# Patient Record
Sex: Female | Born: 1935 | Race: White | Hispanic: No | Marital: Married | Smoking: Never smoker
Health system: Southern US, Community
[De-identification: ages and names within clinical notes are randomized; demographics above are authoritative.]

## PROBLEM LIST (undated history)

## (undated) DIAGNOSIS — E559 Vitamin D deficiency, unspecified: Secondary | ICD-10-CM

## (undated) DIAGNOSIS — I10 Essential (primary) hypertension: Secondary | ICD-10-CM

## (undated) DIAGNOSIS — M549 Dorsalgia, unspecified: Secondary | ICD-10-CM

## (undated) DIAGNOSIS — I059 Rheumatic mitral valve disease, unspecified: Secondary | ICD-10-CM

## (undated) DIAGNOSIS — C801 Malignant (primary) neoplasm, unspecified: Secondary | ICD-10-CM

## (undated) DIAGNOSIS — M1990 Unspecified osteoarthritis, unspecified site: Secondary | ICD-10-CM

## (undated) DIAGNOSIS — S52209A Unspecified fracture of shaft of unspecified ulna, initial encounter for closed fracture: Secondary | ICD-10-CM

## (undated) DIAGNOSIS — R519 Headache, unspecified: Secondary | ICD-10-CM

## (undated) DIAGNOSIS — M810 Age-related osteoporosis without current pathological fracture: Secondary | ICD-10-CM

## (undated) DIAGNOSIS — R011 Cardiac murmur, unspecified: Secondary | ICD-10-CM

## (undated) DIAGNOSIS — G8929 Other chronic pain: Secondary | ICD-10-CM

## (undated) DIAGNOSIS — E039 Hypothyroidism, unspecified: Secondary | ICD-10-CM

## (undated) DIAGNOSIS — R51 Headache: Secondary | ICD-10-CM

## (undated) HISTORY — PX: MASTECTOMY: SHX3

## (undated) HISTORY — PX: ABDOMINAL HYSTERECTOMY: SHX81

## (undated) HISTORY — PX: KNEE ARTHROSCOPY: SHX127

## (undated) HISTORY — PX: TONSILLECTOMY: SUR1361

## (undated) HISTORY — PX: APPENDECTOMY: SHX54

## (undated) HISTORY — PX: VARICOSE VEIN SURGERY: SHX832

## (undated) HISTORY — PX: FINGER MASS EXCISION: SHX1638

## (undated) HISTORY — PX: CERVICAL FUSION: SHX112

## (undated) HISTORY — PX: CATARACT EXTRACTION W/ INTRAOCULAR LENS  IMPLANT, BILATERAL: SHX1307

## (undated) HISTORY — PX: TOTAL MASTECTOMY: SHX6129

## (undated) HISTORY — PX: BIOPSY BREAST: PRO8

## (undated) HISTORY — DX: Age-related osteoporosis without current pathological fracture: M81.0

## (undated) HISTORY — DX: Vitamin D deficiency, unspecified: E55.9

## (undated) HISTORY — PX: BREAST SURGERY: SHX581

## (undated) HISTORY — PX: BACK SURGERY: SHX140

## (undated) HISTORY — PX: CARPAL TUNNEL RELEASE: SHX101

## (undated) HISTORY — PX: BREAST RECONSTRUCTION WITH PLACEMENT OF TISSUE EXPANDER AND FLEX HD (ACELLULAR HYDRATED DERMIS): SHX6295

---

## 2002-03-20 ENCOUNTER — Encounter: Payer: Self-pay | Admitting: *Deleted

## 2002-03-20 ENCOUNTER — Encounter: Admission: RE | Admit: 2002-03-20 | Discharge: 2002-03-20 | Payer: Self-pay | Admitting: *Deleted

## 2006-12-23 ENCOUNTER — Encounter: Admission: RE | Admit: 2006-12-23 | Discharge: 2006-12-23 | Payer: Self-pay | Admitting: Orthopedic Surgery

## 2007-01-07 ENCOUNTER — Encounter: Admission: RE | Admit: 2007-01-07 | Discharge: 2007-01-07 | Payer: Self-pay | Admitting: Orthopedic Surgery

## 2007-01-21 ENCOUNTER — Encounter: Admission: RE | Admit: 2007-01-21 | Discharge: 2007-01-21 | Payer: Self-pay | Admitting: Orthopedic Surgery

## 2007-09-19 ENCOUNTER — Encounter: Admission: RE | Admit: 2007-09-19 | Discharge: 2007-09-19 | Payer: Self-pay | Admitting: Orthopedic Surgery

## 2007-10-12 ENCOUNTER — Encounter: Admission: RE | Admit: 2007-10-12 | Discharge: 2007-10-12 | Payer: Self-pay | Admitting: Neurosurgery

## 2008-02-28 ENCOUNTER — Encounter: Admission: RE | Admit: 2008-02-28 | Discharge: 2008-02-28 | Payer: Self-pay | Admitting: Orthopedic Surgery

## 2008-03-21 ENCOUNTER — Encounter: Admission: RE | Admit: 2008-03-21 | Discharge: 2008-03-21 | Payer: Self-pay | Admitting: Orthopedic Surgery

## 2008-04-04 ENCOUNTER — Encounter: Admission: RE | Admit: 2008-04-04 | Discharge: 2008-04-04 | Payer: Self-pay | Admitting: Orthopedic Surgery

## 2008-07-09 ENCOUNTER — Encounter: Admission: RE | Admit: 2008-07-09 | Discharge: 2008-07-09 | Payer: Self-pay | Admitting: Orthopedic Surgery

## 2008-07-24 ENCOUNTER — Encounter: Admission: RE | Admit: 2008-07-24 | Discharge: 2008-07-24 | Payer: Self-pay | Admitting: Orthopedic Surgery

## 2010-01-13 ENCOUNTER — Ambulatory Visit: Payer: Self-pay | Admitting: Genetic Counselor

## 2010-01-25 ENCOUNTER — Encounter: Admission: RE | Admit: 2010-01-25 | Discharge: 2010-01-25 | Payer: Self-pay | Admitting: Orthopaedic Surgery

## 2010-06-16 ENCOUNTER — Encounter: Payer: Self-pay | Admitting: Genetic Counselor

## 2011-03-19 DIAGNOSIS — M545 Low back pain: Secondary | ICD-10-CM | POA: Diagnosis not present

## 2011-03-19 DIAGNOSIS — M542 Cervicalgia: Secondary | ICD-10-CM | POA: Diagnosis not present

## 2011-03-28 DIAGNOSIS — H6980 Other specified disorders of Eustachian tube, unspecified ear: Secondary | ICD-10-CM | POA: Diagnosis not present

## 2011-03-31 DIAGNOSIS — Z853 Personal history of malignant neoplasm of breast: Secondary | ICD-10-CM | POA: Diagnosis not present

## 2011-03-31 DIAGNOSIS — N63 Unspecified lump in unspecified breast: Secondary | ICD-10-CM | POA: Diagnosis not present

## 2011-04-02 DIAGNOSIS — Z9889 Other specified postprocedural states: Secondary | ICD-10-CM | POA: Diagnosis not present

## 2011-04-02 DIAGNOSIS — C50119 Malignant neoplasm of central portion of unspecified female breast: Secondary | ICD-10-CM | POA: Diagnosis not present

## 2011-04-02 DIAGNOSIS — N63 Unspecified lump in unspecified breast: Secondary | ICD-10-CM | POA: Diagnosis not present

## 2011-04-07 DIAGNOSIS — Z853 Personal history of malignant neoplasm of breast: Secondary | ICD-10-CM | POA: Diagnosis not present

## 2011-04-07 DIAGNOSIS — Z9010 Acquired absence of unspecified breast and nipple: Secondary | ICD-10-CM | POA: Diagnosis not present

## 2011-04-07 DIAGNOSIS — D4860 Neoplasm of uncertain behavior of unspecified breast: Secondary | ICD-10-CM | POA: Diagnosis not present

## 2011-04-07 DIAGNOSIS — N63 Unspecified lump in unspecified breast: Secondary | ICD-10-CM | POA: Diagnosis not present

## 2011-04-16 DIAGNOSIS — D4860 Neoplasm of uncertain behavior of unspecified breast: Secondary | ICD-10-CM | POA: Diagnosis not present

## 2011-04-16 DIAGNOSIS — Z9010 Acquired absence of unspecified breast and nipple: Secondary | ICD-10-CM | POA: Diagnosis not present

## 2011-04-16 DIAGNOSIS — Z853 Personal history of malignant neoplasm of breast: Secondary | ICD-10-CM | POA: Diagnosis not present

## 2011-05-07 DIAGNOSIS — I359 Nonrheumatic aortic valve disorder, unspecified: Secondary | ICD-10-CM | POA: Diagnosis not present

## 2011-05-07 DIAGNOSIS — E039 Hypothyroidism, unspecified: Secondary | ICD-10-CM | POA: Diagnosis not present

## 2011-05-07 DIAGNOSIS — Z1211 Encounter for screening for malignant neoplasm of colon: Secondary | ICD-10-CM | POA: Diagnosis not present

## 2011-05-07 DIAGNOSIS — Z79899 Other long term (current) drug therapy: Secondary | ICD-10-CM | POA: Diagnosis not present

## 2011-05-07 DIAGNOSIS — E782 Mixed hyperlipidemia: Secondary | ICD-10-CM | POA: Diagnosis not present

## 2011-05-13 DIAGNOSIS — M47812 Spondylosis without myelopathy or radiculopathy, cervical region: Secondary | ICD-10-CM | POA: Diagnosis not present

## 2011-05-13 DIAGNOSIS — M542 Cervicalgia: Secondary | ICD-10-CM | POA: Diagnosis not present

## 2011-05-20 DIAGNOSIS — M25519 Pain in unspecified shoulder: Secondary | ICD-10-CM | POA: Diagnosis not present

## 2011-05-20 DIAGNOSIS — M542 Cervicalgia: Secondary | ICD-10-CM | POA: Diagnosis not present

## 2011-06-04 DIAGNOSIS — M5412 Radiculopathy, cervical region: Secondary | ICD-10-CM | POA: Diagnosis not present

## 2011-06-04 DIAGNOSIS — M542 Cervicalgia: Secondary | ICD-10-CM | POA: Diagnosis not present

## 2011-06-04 DIAGNOSIS — M5030 Other cervical disc degeneration, unspecified cervical region: Secondary | ICD-10-CM | POA: Diagnosis not present

## 2011-06-09 DIAGNOSIS — M5030 Other cervical disc degeneration, unspecified cervical region: Secondary | ICD-10-CM | POA: Diagnosis not present

## 2011-06-09 DIAGNOSIS — M5412 Radiculopathy, cervical region: Secondary | ICD-10-CM | POA: Diagnosis not present

## 2011-06-09 DIAGNOSIS — M542 Cervicalgia: Secondary | ICD-10-CM | POA: Diagnosis not present

## 2011-06-23 DIAGNOSIS — M542 Cervicalgia: Secondary | ICD-10-CM | POA: Diagnosis not present

## 2011-06-23 DIAGNOSIS — M5030 Other cervical disc degeneration, unspecified cervical region: Secondary | ICD-10-CM | POA: Diagnosis not present

## 2011-06-29 DIAGNOSIS — M5030 Other cervical disc degeneration, unspecified cervical region: Secondary | ICD-10-CM | POA: Diagnosis not present

## 2011-06-29 DIAGNOSIS — M542 Cervicalgia: Secondary | ICD-10-CM | POA: Diagnosis not present

## 2011-07-01 DIAGNOSIS — C50119 Malignant neoplasm of central portion of unspecified female breast: Secondary | ICD-10-CM | POA: Diagnosis not present

## 2011-07-10 DIAGNOSIS — N390 Urinary tract infection, site not specified: Secondary | ICD-10-CM | POA: Diagnosis not present

## 2011-07-13 DIAGNOSIS — K649 Unspecified hemorrhoids: Secondary | ICD-10-CM | POA: Diagnosis not present

## 2011-07-23 DIAGNOSIS — M542 Cervicalgia: Secondary | ICD-10-CM | POA: Diagnosis not present

## 2011-07-23 DIAGNOSIS — Z79899 Other long term (current) drug therapy: Secondary | ICD-10-CM | POA: Diagnosis not present

## 2011-07-23 DIAGNOSIS — Z5181 Encounter for therapeutic drug level monitoring: Secondary | ICD-10-CM | POA: Diagnosis not present

## 2011-07-23 DIAGNOSIS — M5030 Other cervical disc degeneration, unspecified cervical region: Secondary | ICD-10-CM | POA: Diagnosis not present

## 2011-07-30 DIAGNOSIS — Z853 Personal history of malignant neoplasm of breast: Secondary | ICD-10-CM | POA: Diagnosis not present

## 2011-07-30 DIAGNOSIS — Z800 Family history of malignant neoplasm of digestive organs: Secondary | ICD-10-CM | POA: Diagnosis not present

## 2011-07-30 DIAGNOSIS — I059 Rheumatic mitral valve disease, unspecified: Secondary | ICD-10-CM | POA: Diagnosis not present

## 2011-07-30 DIAGNOSIS — E079 Disorder of thyroid, unspecified: Secondary | ICD-10-CM | POA: Diagnosis not present

## 2011-07-30 DIAGNOSIS — M129 Arthropathy, unspecified: Secondary | ICD-10-CM | POA: Diagnosis not present

## 2011-07-30 DIAGNOSIS — K5730 Diverticulosis of large intestine without perforation or abscess without bleeding: Secondary | ICD-10-CM | POA: Diagnosis not present

## 2011-07-30 DIAGNOSIS — Z1211 Encounter for screening for malignant neoplasm of colon: Secondary | ICD-10-CM | POA: Diagnosis not present

## 2011-07-30 DIAGNOSIS — I10 Essential (primary) hypertension: Secondary | ICD-10-CM | POA: Diagnosis not present

## 2011-07-30 DIAGNOSIS — K644 Residual hemorrhoidal skin tags: Secondary | ICD-10-CM | POA: Diagnosis not present

## 2011-08-18 DIAGNOSIS — Z9010 Acquired absence of unspecified breast and nipple: Secondary | ICD-10-CM | POA: Diagnosis not present

## 2011-08-18 DIAGNOSIS — Z853 Personal history of malignant neoplasm of breast: Secondary | ICD-10-CM | POA: Diagnosis not present

## 2011-09-28 DIAGNOSIS — M545 Low back pain: Secondary | ICD-10-CM | POA: Diagnosis not present

## 2011-09-28 DIAGNOSIS — M542 Cervicalgia: Secondary | ICD-10-CM | POA: Diagnosis not present

## 2011-10-15 DIAGNOSIS — N3000 Acute cystitis without hematuria: Secondary | ICD-10-CM | POA: Diagnosis not present

## 2011-11-02 DIAGNOSIS — M542 Cervicalgia: Secondary | ICD-10-CM | POA: Diagnosis not present

## 2011-11-02 DIAGNOSIS — M47812 Spondylosis without myelopathy or radiculopathy, cervical region: Secondary | ICD-10-CM | POA: Diagnosis not present

## 2011-11-02 DIAGNOSIS — M47817 Spondylosis without myelopathy or radiculopathy, lumbosacral region: Secondary | ICD-10-CM | POA: Diagnosis not present

## 2011-11-05 DIAGNOSIS — Z09 Encounter for follow-up examination after completed treatment for conditions other than malignant neoplasm: Secondary | ICD-10-CM | POA: Diagnosis not present

## 2011-11-05 DIAGNOSIS — Z853 Personal history of malignant neoplasm of breast: Secondary | ICD-10-CM | POA: Diagnosis not present

## 2012-01-06 DIAGNOSIS — M545 Low back pain: Secondary | ICD-10-CM | POA: Diagnosis not present

## 2012-01-06 DIAGNOSIS — M961 Postlaminectomy syndrome, not elsewhere classified: Secondary | ICD-10-CM | POA: Diagnosis not present

## 2012-01-06 DIAGNOSIS — M5126 Other intervertebral disc displacement, lumbar region: Secondary | ICD-10-CM | POA: Diagnosis not present

## 2012-01-12 DIAGNOSIS — M545 Low back pain: Secondary | ICD-10-CM | POA: Diagnosis not present

## 2012-01-12 DIAGNOSIS — M961 Postlaminectomy syndrome, not elsewhere classified: Secondary | ICD-10-CM | POA: Diagnosis not present

## 2012-01-19 DIAGNOSIS — Z9010 Acquired absence of unspecified breast and nipple: Secondary | ICD-10-CM | POA: Diagnosis not present

## 2012-01-19 DIAGNOSIS — Z853 Personal history of malignant neoplasm of breast: Secondary | ICD-10-CM | POA: Diagnosis not present

## 2012-02-02 DIAGNOSIS — M545 Low back pain: Secondary | ICD-10-CM | POA: Diagnosis not present

## 2012-02-02 DIAGNOSIS — M961 Postlaminectomy syndrome, not elsewhere classified: Secondary | ICD-10-CM | POA: Diagnosis not present

## 2012-03-03 DIAGNOSIS — M961 Postlaminectomy syndrome, not elsewhere classified: Secondary | ICD-10-CM | POA: Diagnosis not present

## 2012-03-03 DIAGNOSIS — M545 Low back pain: Secondary | ICD-10-CM | POA: Diagnosis not present

## 2012-03-07 DIAGNOSIS — Z5181 Encounter for therapeutic drug level monitoring: Secondary | ICD-10-CM | POA: Diagnosis not present

## 2012-03-07 DIAGNOSIS — M545 Low back pain: Secondary | ICD-10-CM | POA: Diagnosis not present

## 2012-03-29 DIAGNOSIS — M545 Low back pain: Secondary | ICD-10-CM | POA: Diagnosis not present

## 2012-03-29 DIAGNOSIS — M47817 Spondylosis without myelopathy or radiculopathy, lumbosacral region: Secondary | ICD-10-CM | POA: Diagnosis not present

## 2012-03-29 DIAGNOSIS — M961 Postlaminectomy syndrome, not elsewhere classified: Secondary | ICD-10-CM | POA: Diagnosis not present

## 2012-04-04 DIAGNOSIS — Z1231 Encounter for screening mammogram for malignant neoplasm of breast: Secondary | ICD-10-CM | POA: Diagnosis not present

## 2012-04-04 DIAGNOSIS — Z9889 Other specified postprocedural states: Secondary | ICD-10-CM | POA: Diagnosis not present

## 2012-04-04 DIAGNOSIS — C50119 Malignant neoplasm of central portion of unspecified female breast: Secondary | ICD-10-CM | POA: Diagnosis not present

## 2012-04-05 DIAGNOSIS — Z23 Encounter for immunization: Secondary | ICD-10-CM | POA: Diagnosis not present

## 2012-05-05 DIAGNOSIS — M961 Postlaminectomy syndrome, not elsewhere classified: Secondary | ICD-10-CM | POA: Diagnosis not present

## 2012-05-05 DIAGNOSIS — M47817 Spondylosis without myelopathy or radiculopathy, lumbosacral region: Secondary | ICD-10-CM | POA: Diagnosis not present

## 2012-05-05 DIAGNOSIS — M545 Low back pain: Secondary | ICD-10-CM | POA: Diagnosis not present

## 2012-05-07 DIAGNOSIS — N3000 Acute cystitis without hematuria: Secondary | ICD-10-CM | POA: Diagnosis not present

## 2012-05-09 DIAGNOSIS — I10 Essential (primary) hypertension: Secondary | ICD-10-CM | POA: Diagnosis not present

## 2012-05-09 DIAGNOSIS — I359 Nonrheumatic aortic valve disorder, unspecified: Secondary | ICD-10-CM | POA: Diagnosis not present

## 2012-05-09 DIAGNOSIS — Z79899 Other long term (current) drug therapy: Secondary | ICD-10-CM | POA: Diagnosis not present

## 2012-05-09 DIAGNOSIS — E782 Mixed hyperlipidemia: Secondary | ICD-10-CM | POA: Diagnosis not present

## 2012-05-09 DIAGNOSIS — E039 Hypothyroidism, unspecified: Secondary | ICD-10-CM | POA: Diagnosis not present

## 2012-05-18 DIAGNOSIS — E039 Hypothyroidism, unspecified: Secondary | ICD-10-CM | POA: Diagnosis not present

## 2012-05-18 DIAGNOSIS — I359 Nonrheumatic aortic valve disorder, unspecified: Secondary | ICD-10-CM | POA: Diagnosis not present

## 2012-05-18 DIAGNOSIS — Z0000 Encounter for general adult medical examination without abnormal findings: Secondary | ICD-10-CM | POA: Diagnosis not present

## 2012-05-18 DIAGNOSIS — M949 Disorder of cartilage, unspecified: Secondary | ICD-10-CM | POA: Diagnosis not present

## 2012-05-19 DIAGNOSIS — Z09 Encounter for follow-up examination after completed treatment for conditions other than malignant neoplasm: Secondary | ICD-10-CM | POA: Diagnosis not present

## 2012-05-19 DIAGNOSIS — Z853 Personal history of malignant neoplasm of breast: Secondary | ICD-10-CM | POA: Diagnosis not present

## 2012-05-25 DIAGNOSIS — Z1211 Encounter for screening for malignant neoplasm of colon: Secondary | ICD-10-CM | POA: Diagnosis not present

## 2012-06-07 ENCOUNTER — Other Ambulatory Visit: Payer: Self-pay | Admitting: Rehabilitation

## 2012-06-07 DIAGNOSIS — IMO0002 Reserved for concepts with insufficient information to code with codable children: Secondary | ICD-10-CM | POA: Diagnosis not present

## 2012-06-07 DIAGNOSIS — M545 Low back pain: Secondary | ICD-10-CM | POA: Diagnosis not present

## 2012-06-07 DIAGNOSIS — M961 Postlaminectomy syndrome, not elsewhere classified: Secondary | ICD-10-CM | POA: Diagnosis not present

## 2012-06-11 ENCOUNTER — Ambulatory Visit
Admission: RE | Admit: 2012-06-11 | Discharge: 2012-06-11 | Disposition: A | Discharge status: Home / Self Care | Payer: Medicare Other | Source: Ambulatory Visit | Attending: Rehabilitation | Admitting: Rehabilitation

## 2012-06-11 DIAGNOSIS — M4310 Spondylolisthesis, site unspecified: Secondary | ICD-10-CM | POA: Diagnosis not present

## 2012-06-11 DIAGNOSIS — M48061 Spinal stenosis, lumbar region without neurogenic claudication: Secondary | ICD-10-CM | POA: Diagnosis not present

## 2012-06-11 DIAGNOSIS — M5126 Other intervertebral disc displacement, lumbar region: Secondary | ICD-10-CM | POA: Diagnosis not present

## 2012-06-11 DIAGNOSIS — M545 Low back pain: Secondary | ICD-10-CM

## 2012-06-11 DIAGNOSIS — C50919 Malignant neoplasm of unspecified site of unspecified female breast: Secondary | ICD-10-CM | POA: Diagnosis not present

## 2012-06-11 MED ORDER — GADOBENATE DIMEGLUMINE 529 MG/ML IV SOLN
15.0000 mL | Freq: Once | INTRAVENOUS | Status: AC | PRN
  Administered 2012-06-11: 15 mL via INTRAVENOUS

## 2012-06-20 DIAGNOSIS — D485 Neoplasm of uncertain behavior of skin: Secondary | ICD-10-CM | POA: Diagnosis not present

## 2012-06-20 DIAGNOSIS — L821 Other seborrheic keratosis: Secondary | ICD-10-CM | POA: Diagnosis not present

## 2012-06-20 DIAGNOSIS — L723 Sebaceous cyst: Secondary | ICD-10-CM | POA: Diagnosis not present

## 2012-06-22 DIAGNOSIS — Z79899 Other long term (current) drug therapy: Secondary | ICD-10-CM | POA: Diagnosis not present

## 2012-06-27 DIAGNOSIS — L723 Sebaceous cyst: Secondary | ICD-10-CM | POA: Diagnosis not present

## 2012-06-29 DIAGNOSIS — M5126 Other intervertebral disc displacement, lumbar region: Secondary | ICD-10-CM | POA: Diagnosis not present

## 2012-06-29 DIAGNOSIS — M545 Low back pain: Secondary | ICD-10-CM | POA: Diagnosis not present

## 2012-06-29 DIAGNOSIS — IMO0002 Reserved for concepts with insufficient information to code with codable children: Secondary | ICD-10-CM | POA: Diagnosis not present

## 2012-06-30 DIAGNOSIS — L259 Unspecified contact dermatitis, unspecified cause: Secondary | ICD-10-CM | POA: Diagnosis not present

## 2012-07-06 DIAGNOSIS — L570 Actinic keratosis: Secondary | ICD-10-CM | POA: Diagnosis not present

## 2012-07-28 DIAGNOSIS — M5126 Other intervertebral disc displacement, lumbar region: Secondary | ICD-10-CM | POA: Diagnosis not present

## 2012-07-28 DIAGNOSIS — IMO0002 Reserved for concepts with insufficient information to code with codable children: Secondary | ICD-10-CM | POA: Diagnosis not present

## 2012-07-28 DIAGNOSIS — M545 Low back pain: Secondary | ICD-10-CM | POA: Diagnosis not present

## 2012-07-28 DIAGNOSIS — M961 Postlaminectomy syndrome, not elsewhere classified: Secondary | ICD-10-CM | POA: Diagnosis not present

## 2012-08-15 DIAGNOSIS — IMO0002 Reserved for concepts with insufficient information to code with codable children: Secondary | ICD-10-CM | POA: Diagnosis not present

## 2012-08-15 DIAGNOSIS — M545 Low back pain: Secondary | ICD-10-CM | POA: Diagnosis not present

## 2012-08-15 DIAGNOSIS — M961 Postlaminectomy syndrome, not elsewhere classified: Secondary | ICD-10-CM | POA: Diagnosis not present

## 2012-08-26 DIAGNOSIS — G8929 Other chronic pain: Secondary | ICD-10-CM | POA: Diagnosis not present

## 2012-08-26 DIAGNOSIS — L738 Other specified follicular disorders: Secondary | ICD-10-CM | POA: Diagnosis not present

## 2012-09-06 DIAGNOSIS — G8929 Other chronic pain: Secondary | ICD-10-CM | POA: Diagnosis not present

## 2012-09-06 DIAGNOSIS — L738 Other specified follicular disorders: Secondary | ICD-10-CM | POA: Diagnosis not present

## 2012-09-06 DIAGNOSIS — D485 Neoplasm of uncertain behavior of skin: Secondary | ICD-10-CM | POA: Diagnosis not present

## 2012-09-14 DIAGNOSIS — M545 Low back pain: Secondary | ICD-10-CM | POA: Diagnosis not present

## 2012-09-14 DIAGNOSIS — M961 Postlaminectomy syndrome, not elsewhere classified: Secondary | ICD-10-CM | POA: Diagnosis not present

## 2012-09-14 DIAGNOSIS — M542 Cervicalgia: Secondary | ICD-10-CM | POA: Diagnosis not present

## 2012-10-04 DIAGNOSIS — M999 Biomechanical lesion, unspecified: Secondary | ICD-10-CM | POA: Diagnosis not present

## 2012-10-04 DIAGNOSIS — M9981 Other biomechanical lesions of cervical region: Secondary | ICD-10-CM | POA: Diagnosis not present

## 2012-10-04 DIAGNOSIS — M5137 Other intervertebral disc degeneration, lumbosacral region: Secondary | ICD-10-CM | POA: Diagnosis not present

## 2012-10-05 DIAGNOSIS — M999 Biomechanical lesion, unspecified: Secondary | ICD-10-CM | POA: Diagnosis not present

## 2012-10-05 DIAGNOSIS — M5137 Other intervertebral disc degeneration, lumbosacral region: Secondary | ICD-10-CM | POA: Diagnosis not present

## 2012-10-05 DIAGNOSIS — M9981 Other biomechanical lesions of cervical region: Secondary | ICD-10-CM | POA: Diagnosis not present

## 2012-10-07 DIAGNOSIS — M9981 Other biomechanical lesions of cervical region: Secondary | ICD-10-CM | POA: Diagnosis not present

## 2012-10-07 DIAGNOSIS — M5137 Other intervertebral disc degeneration, lumbosacral region: Secondary | ICD-10-CM | POA: Diagnosis not present

## 2012-10-07 DIAGNOSIS — M999 Biomechanical lesion, unspecified: Secondary | ICD-10-CM | POA: Diagnosis not present

## 2012-10-10 DIAGNOSIS — M9981 Other biomechanical lesions of cervical region: Secondary | ICD-10-CM | POA: Diagnosis not present

## 2012-10-10 DIAGNOSIS — M999 Biomechanical lesion, unspecified: Secondary | ICD-10-CM | POA: Diagnosis not present

## 2012-10-10 DIAGNOSIS — M5137 Other intervertebral disc degeneration, lumbosacral region: Secondary | ICD-10-CM | POA: Diagnosis not present

## 2012-10-12 DIAGNOSIS — M9981 Other biomechanical lesions of cervical region: Secondary | ICD-10-CM | POA: Diagnosis not present

## 2012-10-12 DIAGNOSIS — M999 Biomechanical lesion, unspecified: Secondary | ICD-10-CM | POA: Diagnosis not present

## 2012-10-12 DIAGNOSIS — M5137 Other intervertebral disc degeneration, lumbosacral region: Secondary | ICD-10-CM | POA: Diagnosis not present

## 2012-10-14 DIAGNOSIS — M999 Biomechanical lesion, unspecified: Secondary | ICD-10-CM | POA: Diagnosis not present

## 2012-10-14 DIAGNOSIS — M5137 Other intervertebral disc degeneration, lumbosacral region: Secondary | ICD-10-CM | POA: Diagnosis not present

## 2012-10-14 DIAGNOSIS — M9981 Other biomechanical lesions of cervical region: Secondary | ICD-10-CM | POA: Diagnosis not present

## 2012-10-18 DIAGNOSIS — M9981 Other biomechanical lesions of cervical region: Secondary | ICD-10-CM | POA: Diagnosis not present

## 2012-10-18 DIAGNOSIS — M5137 Other intervertebral disc degeneration, lumbosacral region: Secondary | ICD-10-CM | POA: Diagnosis not present

## 2012-10-18 DIAGNOSIS — M999 Biomechanical lesion, unspecified: Secondary | ICD-10-CM | POA: Diagnosis not present

## 2012-10-20 DIAGNOSIS — M545 Low back pain: Secondary | ICD-10-CM | POA: Diagnosis not present

## 2012-10-20 DIAGNOSIS — M542 Cervicalgia: Secondary | ICD-10-CM | POA: Diagnosis not present

## 2012-10-20 DIAGNOSIS — M961 Postlaminectomy syndrome, not elsewhere classified: Secondary | ICD-10-CM | POA: Diagnosis not present

## 2012-10-21 DIAGNOSIS — M5137 Other intervertebral disc degeneration, lumbosacral region: Secondary | ICD-10-CM | POA: Diagnosis not present

## 2012-10-21 DIAGNOSIS — M9981 Other biomechanical lesions of cervical region: Secondary | ICD-10-CM | POA: Diagnosis not present

## 2012-10-21 DIAGNOSIS — M999 Biomechanical lesion, unspecified: Secondary | ICD-10-CM | POA: Diagnosis not present

## 2012-10-26 DIAGNOSIS — M9981 Other biomechanical lesions of cervical region: Secondary | ICD-10-CM | POA: Diagnosis not present

## 2012-10-26 DIAGNOSIS — M5137 Other intervertebral disc degeneration, lumbosacral region: Secondary | ICD-10-CM | POA: Diagnosis not present

## 2012-10-26 DIAGNOSIS — M999 Biomechanical lesion, unspecified: Secondary | ICD-10-CM | POA: Diagnosis not present

## 2012-10-31 DIAGNOSIS — M5137 Other intervertebral disc degeneration, lumbosacral region: Secondary | ICD-10-CM | POA: Diagnosis not present

## 2012-10-31 DIAGNOSIS — M999 Biomechanical lesion, unspecified: Secondary | ICD-10-CM | POA: Diagnosis not present

## 2012-10-31 DIAGNOSIS — M9981 Other biomechanical lesions of cervical region: Secondary | ICD-10-CM | POA: Diagnosis not present

## 2012-11-04 DIAGNOSIS — M5137 Other intervertebral disc degeneration, lumbosacral region: Secondary | ICD-10-CM | POA: Diagnosis not present

## 2012-11-04 DIAGNOSIS — M999 Biomechanical lesion, unspecified: Secondary | ICD-10-CM | POA: Diagnosis not present

## 2012-11-04 DIAGNOSIS — M9981 Other biomechanical lesions of cervical region: Secondary | ICD-10-CM | POA: Diagnosis not present

## 2012-11-09 DIAGNOSIS — M9981 Other biomechanical lesions of cervical region: Secondary | ICD-10-CM | POA: Diagnosis not present

## 2012-11-09 DIAGNOSIS — M999 Biomechanical lesion, unspecified: Secondary | ICD-10-CM | POA: Diagnosis not present

## 2012-11-09 DIAGNOSIS — M5137 Other intervertebral disc degeneration, lumbosacral region: Secondary | ICD-10-CM | POA: Diagnosis not present

## 2012-11-11 DIAGNOSIS — I10 Essential (primary) hypertension: Secondary | ICD-10-CM | POA: Diagnosis not present

## 2012-11-15 DIAGNOSIS — M9981 Other biomechanical lesions of cervical region: Secondary | ICD-10-CM | POA: Diagnosis not present

## 2012-11-15 DIAGNOSIS — M5137 Other intervertebral disc degeneration, lumbosacral region: Secondary | ICD-10-CM | POA: Diagnosis not present

## 2012-11-15 DIAGNOSIS — M999 Biomechanical lesion, unspecified: Secondary | ICD-10-CM | POA: Diagnosis not present

## 2012-11-21 DIAGNOSIS — M542 Cervicalgia: Secondary | ICD-10-CM | POA: Diagnosis not present

## 2012-11-21 DIAGNOSIS — M5137 Other intervertebral disc degeneration, lumbosacral region: Secondary | ICD-10-CM | POA: Diagnosis not present

## 2012-11-21 DIAGNOSIS — M999 Biomechanical lesion, unspecified: Secondary | ICD-10-CM | POA: Diagnosis not present

## 2012-11-21 DIAGNOSIS — M9981 Other biomechanical lesions of cervical region: Secondary | ICD-10-CM | POA: Diagnosis not present

## 2012-11-24 DIAGNOSIS — Z853 Personal history of malignant neoplasm of breast: Secondary | ICD-10-CM | POA: Diagnosis not present

## 2012-11-24 DIAGNOSIS — Z09 Encounter for follow-up examination after completed treatment for conditions other than malignant neoplasm: Secondary | ICD-10-CM | POA: Diagnosis not present

## 2012-12-02 DIAGNOSIS — M9981 Other biomechanical lesions of cervical region: Secondary | ICD-10-CM | POA: Diagnosis not present

## 2012-12-02 DIAGNOSIS — M5137 Other intervertebral disc degeneration, lumbosacral region: Secondary | ICD-10-CM | POA: Diagnosis not present

## 2012-12-02 DIAGNOSIS — M999 Biomechanical lesion, unspecified: Secondary | ICD-10-CM | POA: Diagnosis not present

## 2012-12-15 DIAGNOSIS — IMO0002 Reserved for concepts with insufficient information to code with codable children: Secondary | ICD-10-CM | POA: Diagnosis not present

## 2012-12-15 DIAGNOSIS — M961 Postlaminectomy syndrome, not elsewhere classified: Secondary | ICD-10-CM | POA: Diagnosis not present

## 2012-12-15 DIAGNOSIS — M545 Low back pain: Secondary | ICD-10-CM | POA: Diagnosis not present

## 2012-12-19 DIAGNOSIS — M5137 Other intervertebral disc degeneration, lumbosacral region: Secondary | ICD-10-CM | POA: Diagnosis not present

## 2012-12-19 DIAGNOSIS — M9981 Other biomechanical lesions of cervical region: Secondary | ICD-10-CM | POA: Diagnosis not present

## 2012-12-19 DIAGNOSIS — M999 Biomechanical lesion, unspecified: Secondary | ICD-10-CM | POA: Diagnosis not present

## 2012-12-28 DIAGNOSIS — M9981 Other biomechanical lesions of cervical region: Secondary | ICD-10-CM | POA: Diagnosis not present

## 2012-12-28 DIAGNOSIS — M5137 Other intervertebral disc degeneration, lumbosacral region: Secondary | ICD-10-CM | POA: Diagnosis not present

## 2012-12-28 DIAGNOSIS — M999 Biomechanical lesion, unspecified: Secondary | ICD-10-CM | POA: Diagnosis not present

## 2012-12-30 DIAGNOSIS — M5137 Other intervertebral disc degeneration, lumbosacral region: Secondary | ICD-10-CM | POA: Diagnosis not present

## 2012-12-30 DIAGNOSIS — M999 Biomechanical lesion, unspecified: Secondary | ICD-10-CM | POA: Diagnosis not present

## 2013-01-02 DIAGNOSIS — M999 Biomechanical lesion, unspecified: Secondary | ICD-10-CM | POA: Diagnosis not present

## 2013-01-02 DIAGNOSIS — M5137 Other intervertebral disc degeneration, lumbosacral region: Secondary | ICD-10-CM | POA: Diagnosis not present

## 2013-01-06 DIAGNOSIS — M999 Biomechanical lesion, unspecified: Secondary | ICD-10-CM | POA: Diagnosis not present

## 2013-01-06 DIAGNOSIS — M5137 Other intervertebral disc degeneration, lumbosacral region: Secondary | ICD-10-CM | POA: Diagnosis not present

## 2013-01-09 DIAGNOSIS — M5137 Other intervertebral disc degeneration, lumbosacral region: Secondary | ICD-10-CM | POA: Diagnosis not present

## 2013-01-09 DIAGNOSIS — M999 Biomechanical lesion, unspecified: Secondary | ICD-10-CM | POA: Diagnosis not present

## 2013-01-11 DIAGNOSIS — M5137 Other intervertebral disc degeneration, lumbosacral region: Secondary | ICD-10-CM | POA: Diagnosis not present

## 2013-01-11 DIAGNOSIS — M999 Biomechanical lesion, unspecified: Secondary | ICD-10-CM | POA: Diagnosis not present

## 2013-01-13 DIAGNOSIS — M999 Biomechanical lesion, unspecified: Secondary | ICD-10-CM | POA: Diagnosis not present

## 2013-01-13 DIAGNOSIS — M5137 Other intervertebral disc degeneration, lumbosacral region: Secondary | ICD-10-CM | POA: Diagnosis not present

## 2013-01-16 DIAGNOSIS — M999 Biomechanical lesion, unspecified: Secondary | ICD-10-CM | POA: Diagnosis not present

## 2013-01-16 DIAGNOSIS — M5137 Other intervertebral disc degeneration, lumbosacral region: Secondary | ICD-10-CM | POA: Diagnosis not present

## 2013-01-17 DIAGNOSIS — Z9010 Acquired absence of unspecified breast and nipple: Secondary | ICD-10-CM | POA: Diagnosis not present

## 2013-01-17 DIAGNOSIS — G8929 Other chronic pain: Secondary | ICD-10-CM | POA: Diagnosis not present

## 2013-01-18 DIAGNOSIS — M5137 Other intervertebral disc degeneration, lumbosacral region: Secondary | ICD-10-CM | POA: Diagnosis not present

## 2013-01-18 DIAGNOSIS — M999 Biomechanical lesion, unspecified: Secondary | ICD-10-CM | POA: Diagnosis not present

## 2013-01-20 DIAGNOSIS — M999 Biomechanical lesion, unspecified: Secondary | ICD-10-CM | POA: Diagnosis not present

## 2013-01-20 DIAGNOSIS — M5137 Other intervertebral disc degeneration, lumbosacral region: Secondary | ICD-10-CM | POA: Diagnosis not present

## 2013-01-31 DIAGNOSIS — IMO0002 Reserved for concepts with insufficient information to code with codable children: Secondary | ICD-10-CM | POA: Diagnosis not present

## 2013-01-31 DIAGNOSIS — M961 Postlaminectomy syndrome, not elsewhere classified: Secondary | ICD-10-CM | POA: Diagnosis not present

## 2013-01-31 DIAGNOSIS — M545 Low back pain: Secondary | ICD-10-CM | POA: Diagnosis not present

## 2013-02-28 DIAGNOSIS — G894 Chronic pain syndrome: Secondary | ICD-10-CM | POA: Diagnosis not present

## 2013-02-28 DIAGNOSIS — IMO0001 Reserved for inherently not codable concepts without codable children: Secondary | ICD-10-CM | POA: Diagnosis not present

## 2013-02-28 DIAGNOSIS — M542 Cervicalgia: Secondary | ICD-10-CM | POA: Diagnosis not present

## 2013-03-28 DIAGNOSIS — M542 Cervicalgia: Secondary | ICD-10-CM | POA: Diagnosis not present

## 2013-03-28 DIAGNOSIS — G894 Chronic pain syndrome: Secondary | ICD-10-CM | POA: Diagnosis not present

## 2013-03-28 DIAGNOSIS — IMO0001 Reserved for inherently not codable concepts without codable children: Secondary | ICD-10-CM | POA: Diagnosis not present

## 2013-03-28 DIAGNOSIS — M961 Postlaminectomy syndrome, not elsewhere classified: Secondary | ICD-10-CM | POA: Diagnosis not present

## 2013-04-10 DIAGNOSIS — C50119 Malignant neoplasm of central portion of unspecified female breast: Secondary | ICD-10-CM | POA: Diagnosis not present

## 2013-04-10 DIAGNOSIS — Z1231 Encounter for screening mammogram for malignant neoplasm of breast: Secondary | ICD-10-CM | POA: Diagnosis not present

## 2013-05-05 DIAGNOSIS — H8120 Vestibular neuronitis, unspecified ear: Secondary | ICD-10-CM | POA: Diagnosis not present

## 2013-05-05 DIAGNOSIS — H6500 Acute serous otitis media, unspecified ear: Secondary | ICD-10-CM | POA: Diagnosis not present

## 2013-05-16 DIAGNOSIS — IMO0001 Reserved for inherently not codable concepts without codable children: Secondary | ICD-10-CM | POA: Diagnosis not present

## 2013-05-16 DIAGNOSIS — M542 Cervicalgia: Secondary | ICD-10-CM | POA: Diagnosis not present

## 2013-05-16 DIAGNOSIS — G894 Chronic pain syndrome: Secondary | ICD-10-CM | POA: Diagnosis not present

## 2013-05-16 DIAGNOSIS — M961 Postlaminectomy syndrome, not elsewhere classified: Secondary | ICD-10-CM | POA: Diagnosis not present

## 2013-05-19 DIAGNOSIS — H8120 Vestibular neuronitis, unspecified ear: Secondary | ICD-10-CM | POA: Diagnosis not present

## 2013-05-19 DIAGNOSIS — Z0000 Encounter for general adult medical examination without abnormal findings: Secondary | ICD-10-CM | POA: Diagnosis not present

## 2013-05-19 DIAGNOSIS — G4700 Insomnia, unspecified: Secondary | ICD-10-CM | POA: Diagnosis not present

## 2013-05-19 DIAGNOSIS — E039 Hypothyroidism, unspecified: Secondary | ICD-10-CM | POA: Diagnosis not present

## 2013-06-01 DIAGNOSIS — H905 Unspecified sensorineural hearing loss: Secondary | ICD-10-CM | POA: Diagnosis not present

## 2013-06-01 DIAGNOSIS — H903 Sensorineural hearing loss, bilateral: Secondary | ICD-10-CM | POA: Diagnosis not present

## 2013-06-01 DIAGNOSIS — H9319 Tinnitus, unspecified ear: Secondary | ICD-10-CM | POA: Diagnosis not present

## 2013-06-01 DIAGNOSIS — R42 Dizziness and giddiness: Secondary | ICD-10-CM | POA: Diagnosis not present

## 2013-06-06 DIAGNOSIS — G894 Chronic pain syndrome: Secondary | ICD-10-CM | POA: Diagnosis not present

## 2013-06-06 DIAGNOSIS — IMO0001 Reserved for inherently not codable concepts without codable children: Secondary | ICD-10-CM | POA: Diagnosis not present

## 2013-06-06 DIAGNOSIS — M961 Postlaminectomy syndrome, not elsewhere classified: Secondary | ICD-10-CM | POA: Diagnosis not present

## 2013-06-06 DIAGNOSIS — M542 Cervicalgia: Secondary | ICD-10-CM | POA: Diagnosis not present

## 2013-07-04 DIAGNOSIS — N3000 Acute cystitis without hematuria: Secondary | ICD-10-CM | POA: Diagnosis not present

## 2013-07-14 DIAGNOSIS — M25519 Pain in unspecified shoulder: Secondary | ICD-10-CM | POA: Diagnosis not present

## 2013-07-18 DIAGNOSIS — M542 Cervicalgia: Secondary | ICD-10-CM | POA: Diagnosis not present

## 2013-07-18 DIAGNOSIS — M5106 Intervertebral disc disorders with myelopathy, lumbar region: Secondary | ICD-10-CM | POA: Diagnosis not present

## 2013-07-18 DIAGNOSIS — M545 Low back pain, unspecified: Secondary | ICD-10-CM | POA: Diagnosis not present

## 2013-07-18 DIAGNOSIS — M5030 Other cervical disc degeneration, unspecified cervical region: Secondary | ICD-10-CM | POA: Diagnosis not present

## 2013-07-20 ENCOUNTER — Other Ambulatory Visit: Payer: Self-pay | Admitting: Orthopaedic Surgery

## 2013-07-20 DIAGNOSIS — H5200 Hypermetropia, unspecified eye: Secondary | ICD-10-CM | POA: Diagnosis not present

## 2013-07-20 DIAGNOSIS — M542 Cervicalgia: Secondary | ICD-10-CM

## 2013-07-20 DIAGNOSIS — H40019 Open angle with borderline findings, low risk, unspecified eye: Secondary | ICD-10-CM | POA: Diagnosis not present

## 2013-07-20 DIAGNOSIS — H40009 Preglaucoma, unspecified, unspecified eye: Secondary | ICD-10-CM | POA: Diagnosis not present

## 2013-07-20 DIAGNOSIS — H2589 Other age-related cataract: Secondary | ICD-10-CM | POA: Diagnosis not present

## 2013-07-26 ENCOUNTER — Ambulatory Visit
Admission: RE | Admit: 2013-07-26 | Discharge: 2013-07-26 | Disposition: A | Discharge status: Home / Self Care | Payer: Medicare Other | Source: Ambulatory Visit | Attending: Orthopaedic Surgery | Admitting: Orthopaedic Surgery

## 2013-07-26 DIAGNOSIS — M4802 Spinal stenosis, cervical region: Secondary | ICD-10-CM | POA: Diagnosis not present

## 2013-07-26 DIAGNOSIS — M47812 Spondylosis without myelopathy or radiculopathy, cervical region: Secondary | ICD-10-CM | POA: Diagnosis not present

## 2013-07-26 DIAGNOSIS — M542 Cervicalgia: Secondary | ICD-10-CM

## 2013-07-26 DIAGNOSIS — M5020 Other cervical disc displacement, unspecified cervical region: Secondary | ICD-10-CM | POA: Diagnosis not present

## 2013-08-04 DIAGNOSIS — H2589 Other age-related cataract: Secondary | ICD-10-CM | POA: Diagnosis not present

## 2013-08-10 DIAGNOSIS — Z09 Encounter for follow-up examination after completed treatment for conditions other than malignant neoplasm: Secondary | ICD-10-CM | POA: Diagnosis not present

## 2013-08-10 DIAGNOSIS — Z853 Personal history of malignant neoplasm of breast: Secondary | ICD-10-CM | POA: Diagnosis not present

## 2013-08-14 DIAGNOSIS — N3000 Acute cystitis without hematuria: Secondary | ICD-10-CM | POA: Diagnosis not present

## 2013-08-22 DIAGNOSIS — M542 Cervicalgia: Secondary | ICD-10-CM | POA: Diagnosis not present

## 2013-08-22 DIAGNOSIS — M5000 Cervical disc disorder with myelopathy, unspecified cervical region: Secondary | ICD-10-CM | POA: Diagnosis not present

## 2013-08-22 DIAGNOSIS — Z79899 Other long term (current) drug therapy: Secondary | ICD-10-CM | POA: Diagnosis not present

## 2013-08-23 DIAGNOSIS — G894 Chronic pain syndrome: Secondary | ICD-10-CM | POA: Diagnosis not present

## 2013-08-23 DIAGNOSIS — Z79899 Other long term (current) drug therapy: Secondary | ICD-10-CM | POA: Diagnosis not present

## 2013-08-24 DIAGNOSIS — Z79899 Other long term (current) drug therapy: Secondary | ICD-10-CM | POA: Diagnosis not present

## 2013-08-24 DIAGNOSIS — M542 Cervicalgia: Secondary | ICD-10-CM | POA: Diagnosis not present

## 2013-08-24 DIAGNOSIS — M5000 Cervical disc disorder with myelopathy, unspecified cervical region: Secondary | ICD-10-CM | POA: Diagnosis not present

## 2013-08-24 DIAGNOSIS — M5106 Intervertebral disc disorders with myelopathy, lumbar region: Secondary | ICD-10-CM | POA: Diagnosis not present

## 2013-08-27 DIAGNOSIS — N3000 Acute cystitis without hematuria: Secondary | ICD-10-CM | POA: Diagnosis not present

## 2013-09-04 DIAGNOSIS — M5090 Cervical disc disorder, unspecified, unspecified cervical region: Secondary | ICD-10-CM | POA: Diagnosis not present

## 2013-09-04 DIAGNOSIS — Z981 Arthrodesis status: Secondary | ICD-10-CM | POA: Diagnosis not present

## 2013-09-04 DIAGNOSIS — M961 Postlaminectomy syndrome, not elsewhere classified: Secondary | ICD-10-CM | POA: Diagnosis not present

## 2013-09-04 DIAGNOSIS — Z91040 Latex allergy status: Secondary | ICD-10-CM | POA: Diagnosis not present

## 2013-09-04 DIAGNOSIS — M4712 Other spondylosis with myelopathy, cervical region: Secondary | ICD-10-CM | POA: Diagnosis not present

## 2013-09-04 DIAGNOSIS — M4716 Other spondylosis with myelopathy, lumbar region: Secondary | ICD-10-CM | POA: Diagnosis present

## 2013-09-04 DIAGNOSIS — Z888 Allergy status to other drugs, medicaments and biological substances status: Secondary | ICD-10-CM | POA: Diagnosis not present

## 2013-09-04 DIAGNOSIS — Z880 Allergy status to penicillin: Secondary | ICD-10-CM | POA: Diagnosis not present

## 2013-09-04 DIAGNOSIS — J309 Allergic rhinitis, unspecified: Secondary | ICD-10-CM | POA: Diagnosis present

## 2013-09-04 DIAGNOSIS — M5000 Cervical disc disorder with myelopathy, unspecified cervical region: Secondary | ICD-10-CM | POA: Diagnosis not present

## 2013-09-04 DIAGNOSIS — G894 Chronic pain syndrome: Secondary | ICD-10-CM | POA: Diagnosis present

## 2013-09-14 DIAGNOSIS — M542 Cervicalgia: Secondary | ICD-10-CM | POA: Diagnosis not present

## 2013-09-14 DIAGNOSIS — M4712 Other spondylosis with myelopathy, cervical region: Secondary | ICD-10-CM | POA: Diagnosis not present

## 2013-12-04 DIAGNOSIS — I10 Essential (primary) hypertension: Secondary | ICD-10-CM | POA: Diagnosis not present

## 2013-12-04 DIAGNOSIS — N390 Urinary tract infection, site not specified: Secondary | ICD-10-CM | POA: Diagnosis not present

## 2013-12-04 DIAGNOSIS — E782 Mixed hyperlipidemia: Secondary | ICD-10-CM | POA: Diagnosis not present

## 2013-12-04 DIAGNOSIS — E876 Hypokalemia: Secondary | ICD-10-CM | POA: Diagnosis not present

## 2013-12-04 DIAGNOSIS — Z79899 Other long term (current) drug therapy: Secondary | ICD-10-CM | POA: Diagnosis not present

## 2013-12-04 DIAGNOSIS — E039 Hypothyroidism, unspecified: Secondary | ICD-10-CM | POA: Diagnosis not present

## 2013-12-06 DIAGNOSIS — Z1211 Encounter for screening for malignant neoplasm of colon: Secondary | ICD-10-CM | POA: Diagnosis not present

## 2013-12-13 DIAGNOSIS — M47812 Spondylosis without myelopathy or radiculopathy, cervical region: Secondary | ICD-10-CM | POA: Diagnosis not present

## 2013-12-13 DIAGNOSIS — M542 Cervicalgia: Secondary | ICD-10-CM | POA: Diagnosis not present

## 2013-12-18 DIAGNOSIS — Z853 Personal history of malignant neoplasm of breast: Secondary | ICD-10-CM | POA: Diagnosis not present

## 2013-12-18 DIAGNOSIS — N3000 Acute cystitis without hematuria: Secondary | ICD-10-CM | POA: Diagnosis not present

## 2013-12-18 DIAGNOSIS — N63 Unspecified lump in breast: Secondary | ICD-10-CM | POA: Diagnosis not present

## 2013-12-19 DIAGNOSIS — M25561 Pain in right knee: Secondary | ICD-10-CM | POA: Diagnosis not present

## 2013-12-21 DIAGNOSIS — C50111 Malignant neoplasm of central portion of right female breast: Secondary | ICD-10-CM | POA: Diagnosis not present

## 2013-12-21 DIAGNOSIS — R928 Other abnormal and inconclusive findings on diagnostic imaging of breast: Secondary | ICD-10-CM | POA: Diagnosis not present

## 2013-12-21 DIAGNOSIS — N63 Unspecified lump in breast: Secondary | ICD-10-CM | POA: Diagnosis not present

## 2013-12-21 DIAGNOSIS — N6459 Other signs and symptoms in breast: Secondary | ICD-10-CM | POA: Diagnosis not present

## 2013-12-21 DIAGNOSIS — Z9011 Acquired absence of right breast and nipple: Secondary | ICD-10-CM | POA: Diagnosis not present

## 2014-01-16 DIAGNOSIS — M47896 Other spondylosis, lumbar region: Secondary | ICD-10-CM | POA: Diagnosis not present

## 2014-01-16 DIAGNOSIS — M47892 Other spondylosis, cervical region: Secondary | ICD-10-CM | POA: Diagnosis not present

## 2014-01-16 DIAGNOSIS — M542 Cervicalgia: Secondary | ICD-10-CM | POA: Diagnosis not present

## 2014-01-18 DIAGNOSIS — Z9011 Acquired absence of right breast and nipple: Secondary | ICD-10-CM | POA: Diagnosis not present

## 2014-01-19 DIAGNOSIS — M542 Cervicalgia: Secondary | ICD-10-CM | POA: Diagnosis not present

## 2014-01-19 DIAGNOSIS — M6281 Muscle weakness (generalized): Secondary | ICD-10-CM | POA: Diagnosis not present

## 2014-01-22 DIAGNOSIS — M6281 Muscle weakness (generalized): Secondary | ICD-10-CM | POA: Diagnosis not present

## 2014-01-22 DIAGNOSIS — M542 Cervicalgia: Secondary | ICD-10-CM | POA: Diagnosis not present

## 2014-01-24 DIAGNOSIS — M6281 Muscle weakness (generalized): Secondary | ICD-10-CM | POA: Diagnosis not present

## 2014-01-24 DIAGNOSIS — M542 Cervicalgia: Secondary | ICD-10-CM | POA: Diagnosis not present

## 2014-01-29 DIAGNOSIS — Z23 Encounter for immunization: Secondary | ICD-10-CM | POA: Diagnosis not present

## 2014-01-29 DIAGNOSIS — M542 Cervicalgia: Secondary | ICD-10-CM | POA: Diagnosis not present

## 2014-01-29 DIAGNOSIS — M6281 Muscle weakness (generalized): Secondary | ICD-10-CM | POA: Diagnosis not present

## 2014-01-31 DIAGNOSIS — M6281 Muscle weakness (generalized): Secondary | ICD-10-CM | POA: Diagnosis not present

## 2014-01-31 DIAGNOSIS — M542 Cervicalgia: Secondary | ICD-10-CM | POA: Diagnosis not present

## 2014-02-05 DIAGNOSIS — M6281 Muscle weakness (generalized): Secondary | ICD-10-CM | POA: Diagnosis not present

## 2014-02-05 DIAGNOSIS — M542 Cervicalgia: Secondary | ICD-10-CM | POA: Diagnosis not present

## 2014-02-07 DIAGNOSIS — M6281 Muscle weakness (generalized): Secondary | ICD-10-CM | POA: Diagnosis not present

## 2014-02-07 DIAGNOSIS — M542 Cervicalgia: Secondary | ICD-10-CM | POA: Diagnosis not present

## 2014-02-12 DIAGNOSIS — M542 Cervicalgia: Secondary | ICD-10-CM | POA: Diagnosis not present

## 2014-02-12 DIAGNOSIS — Z853 Personal history of malignant neoplasm of breast: Secondary | ICD-10-CM | POA: Diagnosis not present

## 2014-02-12 DIAGNOSIS — M6281 Muscle weakness (generalized): Secondary | ICD-10-CM | POA: Diagnosis not present

## 2014-02-14 DIAGNOSIS — M542 Cervicalgia: Secondary | ICD-10-CM | POA: Diagnosis not present

## 2014-02-14 DIAGNOSIS — M6281 Muscle weakness (generalized): Secondary | ICD-10-CM | POA: Diagnosis not present

## 2014-02-19 DIAGNOSIS — M542 Cervicalgia: Secondary | ICD-10-CM | POA: Diagnosis not present

## 2014-02-19 DIAGNOSIS — M6281 Muscle weakness (generalized): Secondary | ICD-10-CM | POA: Diagnosis not present

## 2014-02-20 DIAGNOSIS — G894 Chronic pain syndrome: Secondary | ICD-10-CM | POA: Diagnosis not present

## 2014-02-20 DIAGNOSIS — M542 Cervicalgia: Secondary | ICD-10-CM | POA: Diagnosis not present

## 2014-02-20 DIAGNOSIS — M41125 Adolescent idiopathic scoliosis, thoracolumbar region: Secondary | ICD-10-CM | POA: Diagnosis not present

## 2014-02-20 DIAGNOSIS — Z79891 Long term (current) use of opiate analgesic: Secondary | ICD-10-CM | POA: Diagnosis not present

## 2014-02-20 DIAGNOSIS — Z79899 Other long term (current) drug therapy: Secondary | ICD-10-CM | POA: Diagnosis not present

## 2014-02-20 DIAGNOSIS — M47896 Other spondylosis, lumbar region: Secondary | ICD-10-CM | POA: Diagnosis not present

## 2014-02-21 ENCOUNTER — Other Ambulatory Visit: Payer: Self-pay | Admitting: Orthopaedic Surgery

## 2014-02-21 DIAGNOSIS — M47816 Spondylosis without myelopathy or radiculopathy, lumbar region: Secondary | ICD-10-CM

## 2014-02-21 DIAGNOSIS — M542 Cervicalgia: Secondary | ICD-10-CM | POA: Diagnosis not present

## 2014-02-21 DIAGNOSIS — M6281 Muscle weakness (generalized): Secondary | ICD-10-CM | POA: Diagnosis not present

## 2014-02-26 DIAGNOSIS — M542 Cervicalgia: Secondary | ICD-10-CM | POA: Diagnosis not present

## 2014-02-26 DIAGNOSIS — M6281 Muscle weakness (generalized): Secondary | ICD-10-CM | POA: Diagnosis not present

## 2014-02-27 ENCOUNTER — Ambulatory Visit
Admission: RE | Admit: 2014-02-27 | Discharge: 2014-02-27 | Disposition: A | Discharge status: Home / Self Care | Payer: Medicare Other | Source: Ambulatory Visit | Attending: Orthopaedic Surgery | Admitting: Orthopaedic Surgery

## 2014-02-27 DIAGNOSIS — M5136 Other intervertebral disc degeneration, lumbar region: Secondary | ICD-10-CM | POA: Diagnosis not present

## 2014-02-27 DIAGNOSIS — M47817 Spondylosis without myelopathy or radiculopathy, lumbosacral region: Secondary | ICD-10-CM | POA: Diagnosis not present

## 2014-02-27 DIAGNOSIS — M5127 Other intervertebral disc displacement, lumbosacral region: Secondary | ICD-10-CM | POA: Diagnosis not present

## 2014-02-27 DIAGNOSIS — M4806 Spinal stenosis, lumbar region: Secondary | ICD-10-CM | POA: Diagnosis not present

## 2014-02-27 DIAGNOSIS — M47816 Spondylosis without myelopathy or radiculopathy, lumbar region: Secondary | ICD-10-CM

## 2014-02-28 DIAGNOSIS — M542 Cervicalgia: Secondary | ICD-10-CM | POA: Diagnosis not present

## 2014-02-28 DIAGNOSIS — M6281 Muscle weakness (generalized): Secondary | ICD-10-CM | POA: Diagnosis not present

## 2014-03-05 DIAGNOSIS — M6281 Muscle weakness (generalized): Secondary | ICD-10-CM | POA: Diagnosis not present

## 2014-03-05 DIAGNOSIS — M542 Cervicalgia: Secondary | ICD-10-CM | POA: Diagnosis not present

## 2014-03-12 DIAGNOSIS — M5126 Other intervertebral disc displacement, lumbar region: Secondary | ICD-10-CM | POA: Diagnosis not present

## 2014-03-14 DIAGNOSIS — M542 Cervicalgia: Secondary | ICD-10-CM | POA: Diagnosis not present

## 2014-03-14 DIAGNOSIS — M6281 Muscle weakness (generalized): Secondary | ICD-10-CM | POA: Diagnosis not present

## 2014-03-19 DIAGNOSIS — M542 Cervicalgia: Secondary | ICD-10-CM | POA: Diagnosis not present

## 2014-03-19 DIAGNOSIS — M6281 Muscle weakness (generalized): Secondary | ICD-10-CM | POA: Diagnosis not present

## 2014-03-19 DIAGNOSIS — L2089 Other atopic dermatitis: Secondary | ICD-10-CM | POA: Diagnosis not present

## 2014-03-21 DIAGNOSIS — M542 Cervicalgia: Secondary | ICD-10-CM | POA: Diagnosis not present

## 2014-03-21 DIAGNOSIS — M6281 Muscle weakness (generalized): Secondary | ICD-10-CM | POA: Diagnosis not present

## 2014-03-26 DIAGNOSIS — M6281 Muscle weakness (generalized): Secondary | ICD-10-CM | POA: Diagnosis not present

## 2014-03-26 DIAGNOSIS — M542 Cervicalgia: Secondary | ICD-10-CM | POA: Diagnosis not present

## 2014-03-28 DIAGNOSIS — J069 Acute upper respiratory infection, unspecified: Secondary | ICD-10-CM | POA: Diagnosis not present

## 2014-03-28 DIAGNOSIS — M542 Cervicalgia: Secondary | ICD-10-CM | POA: Diagnosis not present

## 2014-03-28 DIAGNOSIS — M6281 Muscle weakness (generalized): Secondary | ICD-10-CM | POA: Diagnosis not present

## 2014-03-28 DIAGNOSIS — J040 Acute laryngitis: Secondary | ICD-10-CM | POA: Diagnosis not present

## 2014-04-02 DIAGNOSIS — M6281 Muscle weakness (generalized): Secondary | ICD-10-CM | POA: Diagnosis not present

## 2014-04-02 DIAGNOSIS — M542 Cervicalgia: Secondary | ICD-10-CM | POA: Diagnosis not present

## 2014-04-04 DIAGNOSIS — M542 Cervicalgia: Secondary | ICD-10-CM | POA: Diagnosis not present

## 2014-04-04 DIAGNOSIS — M6281 Muscle weakness (generalized): Secondary | ICD-10-CM | POA: Diagnosis not present

## 2014-04-09 DIAGNOSIS — M542 Cervicalgia: Secondary | ICD-10-CM | POA: Diagnosis not present

## 2014-04-09 DIAGNOSIS — M6281 Muscle weakness (generalized): Secondary | ICD-10-CM | POA: Diagnosis not present

## 2014-04-11 DIAGNOSIS — M6281 Muscle weakness (generalized): Secondary | ICD-10-CM | POA: Diagnosis not present

## 2014-04-11 DIAGNOSIS — M542 Cervicalgia: Secondary | ICD-10-CM | POA: Diagnosis not present

## 2014-04-16 DIAGNOSIS — M6281 Muscle weakness (generalized): Secondary | ICD-10-CM | POA: Diagnosis not present

## 2014-04-16 DIAGNOSIS — M542 Cervicalgia: Secondary | ICD-10-CM | POA: Diagnosis not present

## 2014-04-18 DIAGNOSIS — M6281 Muscle weakness (generalized): Secondary | ICD-10-CM | POA: Diagnosis not present

## 2014-04-18 DIAGNOSIS — M542 Cervicalgia: Secondary | ICD-10-CM | POA: Diagnosis not present

## 2014-04-23 DIAGNOSIS — H25811 Combined forms of age-related cataract, right eye: Secondary | ICD-10-CM | POA: Diagnosis not present

## 2014-04-23 DIAGNOSIS — H52201 Unspecified astigmatism, right eye: Secondary | ICD-10-CM | POA: Diagnosis not present

## 2014-04-23 DIAGNOSIS — H2511 Age-related nuclear cataract, right eye: Secondary | ICD-10-CM | POA: Diagnosis not present

## 2014-04-23 DIAGNOSIS — H25812 Combined forms of age-related cataract, left eye: Secondary | ICD-10-CM | POA: Diagnosis not present

## 2014-04-25 DIAGNOSIS — M5106 Intervertebral disc disorders with myelopathy, lumbar region: Secondary | ICD-10-CM | POA: Diagnosis not present

## 2014-04-25 DIAGNOSIS — G894 Chronic pain syndrome: Secondary | ICD-10-CM | POA: Diagnosis not present

## 2014-05-03 DIAGNOSIS — I10 Essential (primary) hypertension: Secondary | ICD-10-CM | POA: Diagnosis not present

## 2014-05-03 DIAGNOSIS — H18232 Secondary corneal edema, left eye: Secondary | ICD-10-CM | POA: Diagnosis not present

## 2014-05-03 DIAGNOSIS — H25812 Combined forms of age-related cataract, left eye: Secondary | ICD-10-CM | POA: Diagnosis not present

## 2014-05-03 DIAGNOSIS — Z9842 Cataract extraction status, left eye: Secondary | ICD-10-CM | POA: Diagnosis not present

## 2014-05-03 DIAGNOSIS — H2512 Age-related nuclear cataract, left eye: Secondary | ICD-10-CM | POA: Diagnosis not present

## 2014-05-03 DIAGNOSIS — H40013 Open angle with borderline findings, low risk, bilateral: Secondary | ICD-10-CM | POA: Diagnosis not present

## 2014-05-03 DIAGNOSIS — H52202 Unspecified astigmatism, left eye: Secondary | ICD-10-CM | POA: Diagnosis not present

## 2014-05-03 DIAGNOSIS — H20012 Primary iridocyclitis, left eye: Secondary | ICD-10-CM | POA: Diagnosis not present

## 2014-05-03 DIAGNOSIS — Z961 Presence of intraocular lens: Secondary | ICD-10-CM | POA: Diagnosis not present

## 2014-05-28 DIAGNOSIS — M5126 Other intervertebral disc displacement, lumbar region: Secondary | ICD-10-CM | POA: Diagnosis not present

## 2014-06-11 DIAGNOSIS — H905 Unspecified sensorineural hearing loss: Secondary | ICD-10-CM | POA: Diagnosis not present

## 2014-06-11 DIAGNOSIS — H9319 Tinnitus, unspecified ear: Secondary | ICD-10-CM | POA: Diagnosis not present

## 2014-06-12 DIAGNOSIS — M5126 Other intervertebral disc displacement, lumbar region: Secondary | ICD-10-CM | POA: Diagnosis not present

## 2014-06-12 DIAGNOSIS — M5106 Intervertebral disc disorders with myelopathy, lumbar region: Secondary | ICD-10-CM | POA: Diagnosis not present

## 2014-06-12 DIAGNOSIS — M47896 Other spondylosis, lumbar region: Secondary | ICD-10-CM | POA: Diagnosis not present

## 2014-06-12 DIAGNOSIS — M47892 Other spondylosis, cervical region: Secondary | ICD-10-CM | POA: Diagnosis not present

## 2014-06-13 DIAGNOSIS — M5126 Other intervertebral disc displacement, lumbar region: Secondary | ICD-10-CM | POA: Diagnosis not present

## 2014-06-28 DIAGNOSIS — M5106 Intervertebral disc disorders with myelopathy, lumbar region: Secondary | ICD-10-CM | POA: Diagnosis not present

## 2014-06-28 DIAGNOSIS — G894 Chronic pain syndrome: Secondary | ICD-10-CM | POA: Diagnosis not present

## 2014-06-28 DIAGNOSIS — M542 Cervicalgia: Secondary | ICD-10-CM | POA: Diagnosis not present

## 2014-07-15 DIAGNOSIS — M545 Low back pain: Secondary | ICD-10-CM | POA: Diagnosis not present

## 2014-07-20 DIAGNOSIS — J029 Acute pharyngitis, unspecified: Secondary | ICD-10-CM | POA: Diagnosis not present

## 2014-07-25 DIAGNOSIS — J0100 Acute maxillary sinusitis, unspecified: Secondary | ICD-10-CM | POA: Diagnosis not present

## 2014-07-26 DIAGNOSIS — G894 Chronic pain syndrome: Secondary | ICD-10-CM | POA: Diagnosis not present

## 2014-07-26 DIAGNOSIS — M4716 Other spondylosis with myelopathy, lumbar region: Secondary | ICD-10-CM | POA: Diagnosis not present

## 2014-07-26 DIAGNOSIS — M47896 Other spondylosis, lumbar region: Secondary | ICD-10-CM | POA: Diagnosis not present

## 2014-08-13 DIAGNOSIS — Z853 Personal history of malignant neoplasm of breast: Secondary | ICD-10-CM | POA: Diagnosis not present

## 2014-08-21 DIAGNOSIS — M5126 Other intervertebral disc displacement, lumbar region: Secondary | ICD-10-CM | POA: Diagnosis not present

## 2014-09-10 DIAGNOSIS — M25561 Pain in right knee: Secondary | ICD-10-CM | POA: Diagnosis not present

## 2014-09-11 ENCOUNTER — Other Ambulatory Visit: Payer: Self-pay | Admitting: Orthopaedic Surgery

## 2014-09-11 DIAGNOSIS — M25561 Pain in right knee: Secondary | ICD-10-CM

## 2014-09-15 ENCOUNTER — Ambulatory Visit
Admission: RE | Admit: 2014-09-15 | Discharge: 2014-09-15 | Disposition: A | Discharge status: Home / Self Care | Payer: Medicare Other | Source: Ambulatory Visit | Attending: Orthopaedic Surgery | Admitting: Orthopaedic Surgery

## 2014-09-15 DIAGNOSIS — M7121 Synovial cyst of popliteal space [Baker], right knee: Secondary | ICD-10-CM | POA: Diagnosis not present

## 2014-09-15 DIAGNOSIS — M25561 Pain in right knee: Secondary | ICD-10-CM

## 2014-09-15 DIAGNOSIS — M1711 Unilateral primary osteoarthritis, right knee: Secondary | ICD-10-CM | POA: Diagnosis not present

## 2014-09-15 DIAGNOSIS — M23341 Other meniscus derangements, anterior horn of lateral meniscus, right knee: Secondary | ICD-10-CM | POA: Diagnosis not present

## 2014-09-25 DIAGNOSIS — M5126 Other intervertebral disc displacement, lumbar region: Secondary | ICD-10-CM | POA: Diagnosis not present

## 2014-10-02 DIAGNOSIS — M25561 Pain in right knee: Secondary | ICD-10-CM | POA: Diagnosis not present

## 2014-10-22 DIAGNOSIS — S83241A Other tear of medial meniscus, current injury, right knee, initial encounter: Secondary | ICD-10-CM | POA: Diagnosis not present

## 2014-10-22 DIAGNOSIS — M23231 Derangement of other medial meniscus due to old tear or injury, right knee: Secondary | ICD-10-CM | POA: Diagnosis not present

## 2014-10-22 DIAGNOSIS — M23051 Cystic meniscus, posterior horn of lateral meniscus, right knee: Secondary | ICD-10-CM | POA: Diagnosis not present

## 2014-10-22 DIAGNOSIS — G8918 Other acute postprocedural pain: Secondary | ICD-10-CM | POA: Diagnosis not present

## 2014-10-22 DIAGNOSIS — M6751 Plica syndrome, right knee: Secondary | ICD-10-CM | POA: Diagnosis not present

## 2014-10-22 DIAGNOSIS — M179 Osteoarthritis of knee, unspecified: Secondary | ICD-10-CM | POA: Diagnosis not present

## 2014-10-22 DIAGNOSIS — M23261 Derangement of other lateral meniscus due to old tear or injury, right knee: Secondary | ICD-10-CM | POA: Diagnosis not present

## 2014-10-22 DIAGNOSIS — S83281A Other tear of lateral meniscus, current injury, right knee, initial encounter: Secondary | ICD-10-CM | POA: Diagnosis not present

## 2014-10-22 DIAGNOSIS — M94261 Chondromalacia, right knee: Secondary | ICD-10-CM | POA: Diagnosis not present

## 2014-10-30 DIAGNOSIS — S83281D Other tear of lateral meniscus, current injury, right knee, subsequent encounter: Secondary | ICD-10-CM | POA: Diagnosis not present

## 2014-10-30 DIAGNOSIS — S83241D Other tear of medial meniscus, current injury, right knee, subsequent encounter: Secondary | ICD-10-CM | POA: Diagnosis not present

## 2014-11-06 DIAGNOSIS — G894 Chronic pain syndrome: Secondary | ICD-10-CM | POA: Diagnosis not present

## 2014-11-06 DIAGNOSIS — M5106 Intervertebral disc disorders with myelopathy, lumbar region: Secondary | ICD-10-CM | POA: Diagnosis not present

## 2014-11-06 DIAGNOSIS — M4716 Other spondylosis with myelopathy, lumbar region: Secondary | ICD-10-CM | POA: Diagnosis not present

## 2014-12-04 DIAGNOSIS — Z79891 Long term (current) use of opiate analgesic: Secondary | ICD-10-CM | POA: Diagnosis not present

## 2014-12-04 DIAGNOSIS — M25561 Pain in right knee: Secondary | ICD-10-CM | POA: Diagnosis not present

## 2014-12-04 DIAGNOSIS — S83241D Other tear of medial meniscus, current injury, right knee, subsequent encounter: Secondary | ICD-10-CM | POA: Diagnosis not present

## 2014-12-04 DIAGNOSIS — G894 Chronic pain syndrome: Secondary | ICD-10-CM | POA: Diagnosis not present

## 2014-12-04 DIAGNOSIS — S83281D Other tear of lateral meniscus, current injury, right knee, subsequent encounter: Secondary | ICD-10-CM | POA: Diagnosis not present

## 2014-12-05 DIAGNOSIS — G894 Chronic pain syndrome: Secondary | ICD-10-CM | POA: Diagnosis not present

## 2014-12-05 DIAGNOSIS — Z79899 Other long term (current) drug therapy: Secondary | ICD-10-CM | POA: Diagnosis not present

## 2014-12-10 DIAGNOSIS — I6789 Other cerebrovascular disease: Secondary | ICD-10-CM | POA: Diagnosis not present

## 2014-12-12 DIAGNOSIS — R51 Headache: Secondary | ICD-10-CM | POA: Diagnosis not present

## 2014-12-12 DIAGNOSIS — I6789 Other cerebrovascular disease: Secondary | ICD-10-CM | POA: Diagnosis not present

## 2014-12-24 DIAGNOSIS — Z1231 Encounter for screening mammogram for malignant neoplasm of breast: Secondary | ICD-10-CM | POA: Diagnosis not present

## 2014-12-26 DIAGNOSIS — M5126 Other intervertebral disc displacement, lumbar region: Secondary | ICD-10-CM | POA: Diagnosis not present

## 2014-12-31 DIAGNOSIS — Z79899 Other long term (current) drug therapy: Secondary | ICD-10-CM | POA: Diagnosis not present

## 2014-12-31 DIAGNOSIS — Z1389 Encounter for screening for other disorder: Secondary | ICD-10-CM | POA: Diagnosis not present

## 2014-12-31 DIAGNOSIS — E039 Hypothyroidism, unspecified: Secondary | ICD-10-CM | POA: Diagnosis not present

## 2015-01-01 DIAGNOSIS — Z1211 Encounter for screening for malignant neoplasm of colon: Secondary | ICD-10-CM | POA: Diagnosis not present

## 2015-01-08 ENCOUNTER — Other Ambulatory Visit: Payer: Self-pay | Admitting: Physician Assistant

## 2015-01-08 DIAGNOSIS — S83281D Other tear of lateral meniscus, current injury, right knee, subsequent encounter: Secondary | ICD-10-CM | POA: Diagnosis not present

## 2015-01-08 DIAGNOSIS — S83241D Other tear of medial meniscus, current injury, right knee, subsequent encounter: Secondary | ICD-10-CM | POA: Diagnosis not present

## 2015-01-08 NOTE — H&P (Signed)
TOTAL KNEE ADMISSION H&P  Patient is being admitted for right total knee arthroplasty.  Subjective:  Chief Complaint:right knee pain.  HPI: Colleen [name], 80 y.o. female, has a history of pain and functional disability in the right knee due to arthritis and has failed non-surgical conservative treatments for greater than 12 weeks to includeNSAID's and/or analgesics, corticosteriod injections, viscosupplementation injections, use of assistive devices and activity modification.  Onset of symptoms was gradual, starting 2 years ago with rapidlly worsening course since that time. The patient noted prior procedures on the knee to include  arthroscopy and menisectomy on the right knee(s).  Patient currently rates pain in the right knee(s) at 8 out of 10 with activity. Patient has night pain, worsening of pain with activity and weight bearing, pain that interferes with activities of daily living, pain with passive range of motion, crepitus and joint swelling.  Patient has evidence of subchondral sclerosis and joint space narrowing by imaging studies. There is no active infection.  There are no active problems to display for this patient.  No past medical history on file.  No past surgical history on file.   (Not in a hospital admission) Not on File  Social History  Substance Use Topics  . Smoking status: Not on file  . Smokeless tobacco: Not on file  . Alcohol Use: Not on file    No family history on file.   Review of Systems  Constitutional: Negative.   HENT: Negative.   Eyes: Negative.   Respiratory: Negative.   Cardiovascular: Negative.   Gastrointestinal: Negative.   Genitourinary: Negative.   Musculoskeletal: Positive for joint pain.  Skin: Negative.   Neurological: Negative.   Endo/Heme/Allergies: Negative.   Psychiatric/Behavioral: Negative.     Objective:  Physical Exam  Constitutional: She is oriented to person, place, and time. She appears well-developed and  well-nourished.  HENT:  Head: Normocephalic and atraumatic.  Eyes: EOM are normal. Pupils are equal, round, and reactive to light.  Neck: Normal range of motion. Neck supple.  Cardiovascular: Normal rate and regular rhythm.  Exam reveals no friction rub.   No murmur heard. Respiratory: Effort normal and breath sounds normal.  GI: Soft. Bowel sounds are normal.  Neurological: She is alert and oriented to person, place, and time.  Skin: Skin is warm and dry.  Psychiatric: She has a normal mood and affect. Her behavior is normal. Judgment and thought content normal.    Vital signs in last 24 hours: @VSRANGES @  Labs:   There is no height or weight on file to calculate BMI.   Imaging Review Plain radiographs demonstrate severe degenerative joint disease of the right knee(s). The overall alignment ismild valgus. The bone quality appears to be fair for age and reported activity level.  Assessment/Plan:  End stage arthritis, right knee   The patient history, physical examination, clinical judgment of the provider and imaging studies are consistent with end stage degenerative joint disease of the right knee(s) and total knee arthroplasty is deemed medically necessary. The treatment options including medical management, injection therapy arthroscopy and arthroplasty were discussed at length. The risks and benefits of total knee arthroplasty were presented and reviewed. The risks due to aseptic loosening, infection, stiffness, patella tracking problems, thromboembolic complications and other imponderables were discussed. The patient acknowledged the explanation, agreed to proceed with the plan and consent was signed. Patient is being admitted for inpatient treatment for surgery, pain control, PT, OT, prophylactic antibiotics, VTE prophylaxis, progressive ambulation and ADL's and discharge planning.  The patient is planning to be discharged home with home health services

## 2015-01-10 ENCOUNTER — Encounter (HOSPITAL_COMMUNITY): Payer: Self-pay

## 2015-01-10 ENCOUNTER — Encounter (HOSPITAL_COMMUNITY)
Admission: RE | Admit: 2015-01-10 | Discharge: 2015-01-10 | Disposition: A | Discharge status: Home / Self Care | Payer: Medicare Other | Source: Ambulatory Visit | Attending: Orthopedic Surgery | Admitting: Orthopedic Surgery

## 2015-01-10 DIAGNOSIS — Z01812 Encounter for preprocedural laboratory examination: Secondary | ICD-10-CM | POA: Diagnosis not present

## 2015-01-10 DIAGNOSIS — M1711 Unilateral primary osteoarthritis, right knee: Secondary | ICD-10-CM | POA: Diagnosis not present

## 2015-01-10 DIAGNOSIS — Z0183 Encounter for blood typing: Secondary | ICD-10-CM | POA: Insufficient documentation

## 2015-01-10 HISTORY — DX: Essential (primary) hypertension: I10

## 2015-01-10 HISTORY — DX: Unspecified osteoarthritis, unspecified site: M19.90

## 2015-01-10 HISTORY — DX: Rheumatic mitral valve disease, unspecified: I05.9

## 2015-01-10 HISTORY — DX: Cardiac murmur, unspecified: R01.1

## 2015-01-10 HISTORY — DX: Headache, unspecified: R51.9

## 2015-01-10 HISTORY — DX: Headache: R51

## 2015-01-10 HISTORY — DX: Hypothyroidism, unspecified: E03.9

## 2015-01-10 HISTORY — DX: Malignant (primary) neoplasm, unspecified: C80.1

## 2015-01-10 LAB — CBC WITH DIFFERENTIAL/PLATELET
BASOS PCT: 0 %
Basophils Absolute: 0 10*3/uL (ref 0.0–0.1)
EOS ABS: 0.1 10*3/uL (ref 0.0–0.7)
EOS PCT: 1 %
HEMATOCRIT: 41.3 % (ref 36.0–46.0)
Hemoglobin: 13.3 g/dL (ref 12.0–15.0)
Lymphocytes Relative: 26 %
Lymphs Abs: 1.7 10*3/uL (ref 0.7–4.0)
MCH: 29 pg (ref 26.0–34.0)
MCHC: 32.2 g/dL (ref 30.0–36.0)
MCV: 90 fL (ref 78.0–100.0)
MONO ABS: 0.7 10*3/uL (ref 0.1–1.0)
MONOS PCT: 10 %
NEUTROS ABS: 4.1 10*3/uL (ref 1.7–7.7)
Neutrophils Relative %: 63 %
Platelets: 289 10*3/uL (ref 150–400)
RBC: 4.59 MIL/uL (ref 3.87–5.11)
RDW: 14.3 % (ref 11.5–15.5)
WBC: 6.5 10*3/uL (ref 4.0–10.5)

## 2015-01-10 LAB — TYPE AND SCREEN
ABO/RH(D): O POS
Antibody Screen: NEGATIVE

## 2015-01-10 LAB — SURGICAL PCR SCREEN
MRSA, PCR: NEGATIVE
STAPHYLOCOCCUS AUREUS: POSITIVE — AB

## 2015-01-10 LAB — COMPREHENSIVE METABOLIC PANEL
ALBUMIN: 3.8 g/dL (ref 3.5–5.0)
ALT: 21 U/L (ref 14–54)
ANION GAP: 9 (ref 5–15)
AST: 25 U/L (ref 15–41)
Alkaline Phosphatase: 69 U/L (ref 38–126)
BILIRUBIN TOTAL: 0.5 mg/dL (ref 0.3–1.2)
CALCIUM: 9.3 mg/dL (ref 8.9–10.3)
CO2: 27 mmol/L (ref 22–32)
CREATININE: 0.75 mg/dL (ref 0.44–1.00)
Chloride: 102 mmol/L (ref 101–111)
GFR calc Af Amer: >60 mL/min (ref >60)
GFR calc non Af Amer: >60 mL/min (ref >60)
Glucose, Bld: 105 mg/dL — ABNORMAL HIGH (ref 65–99)
POTASSIUM: 3.6 mmol/L (ref 3.5–5.1)
Sodium: 138 mmol/L (ref 135–145)
TOTAL PROTEIN: 6.2 g/dL — AB (ref 6.5–8.1)

## 2015-01-10 LAB — PROTIME-INR
INR: 1.05 (ref 0.00–1.49)
PROTHROMBIN TIME: 13.9 s (ref 11.6–15.2)

## 2015-01-10 LAB — APTT: aPTT: 29 seconds (ref 24–37)

## 2015-01-10 LAB — ABO/RH: ABO/RH(D): O POS

## 2015-01-10 NOTE — Pre-Procedure Instructions (Signed)
Chassie Pennix  01/10/2015      [facility] - [place], [address] AT [address] [place] [address] Phone: [phone] Fax: [phone]    Your procedure is scheduled on   Wednesday  01/23/15  Report to [facility] Admitting at 900 A.M.  Call this number if you have problems the morning of surgery:  [phone]   Remember:  Do not eat food or drink liquids after midnight.  Take these medicines the morning of surgery with A SIP OF WATER   GABAPENTIN (NEURONTIN), HYDROCODONE IF NEEDED, LEVOTHYROXINE   Do not wear jewelry, make-up or nail polish.  Do not wear lotions, powders, or perfumes.  You may wear deodorant.  Do not shave 48 hours prior to surgery.  Men may shave face and neck.  Do not bring valuables to the hospital.  [facility] is not responsible for any belongings or valuables.  Contacts, dentures or bridgework may not be worn into surgery.  Leave your suitcase in the car.  After surgery it may be brought to your room.  For patients admitted to the hospital, discharge time will be determined by your treatment team.  Patients discharged the day of surgery will not be allowed to drive home.   Name and phone number of your driver:   Special instructions:  [place] - Preparing for Surgery  Before surgery, you can play an important role.  Because skin is not sterile, your skin needs to be as free of germs as possible.  You can reduce the number of germs on you skin by washing with CHG (chlorahexidine gluconate) soap before surgery.  CHG is an antiseptic cleaner which kills germs and bonds with the skin to continue killing germs even after washing.  Please DO NOT use if you have an allergy to CHG or antibacterial soaps.  If your skin becomes reddened/irritated stop using the CHG and inform your nurse when you arrive at Short Stay.  Do not shave (including legs and underarms) for  at least 48 hours prior to the first CHG shower.  You may shave your face.  Please follow these instructions carefully:   1.  Shower with CHG Soap the night before surgery and the                                morning of Surgery.  2.  If you choose to wash your hair, wash your hair first as usual with your       normal shampoo.  3.  After you shampoo, rinse your hair and body thoroughly to remove the                      Shampoo.  4.  Use CHG as you would any other liquid soap.  You can apply chg directly       to the skin and wash gently with scrungie or a clean washcloth.  5.  Apply the CHG Soap to your body ONLY FROM THE NECK DOWN.        Do not use on open wounds or open sores.  Avoid contact with your eyes,       ears, mouth and genitals (private parts).  Wash genitals (private parts)       with your normal soap.  6.  Wash thoroughly,  paying special attention to the area where your surgery        will be performed.  7.  Thoroughly rinse your body with warm water from the neck down.  8.  DO NOT shower/wash with your normal soap after using and rinsing off       the CHG Soap.  9.  Pat yourself dry with a clean towel.            10.  Wear clean pajamas.            11.  Place clean sheets on your bed the night of your first shower and do not        sleep with pets.  Day of Surgery  Do not apply any lotions/deoderants the morning of surgery.  Please wear clean clothes to the hospital/surgery center.    Please read over the following fact sheets that you were given. Pain Booklet, Coughing and Deep Breathing, Blood Transfusion Information, Total Joint Packet, MRSA Information and Surgical Site Infection Prevention

## 2015-01-11 DIAGNOSIS — Z23 Encounter for immunization: Secondary | ICD-10-CM | POA: Diagnosis not present

## 2015-01-11 LAB — URINE CULTURE

## 2015-01-15 HISTORY — PX: JOINT REPLACEMENT: SHX530

## 2015-01-22 MED ORDER — SODIUM CHLORIDE 0.9 % IV SOLN
1250.0000 mg | INTRAVENOUS | Status: AC
  Administered 2015-01-23: 1250 mg via INTRAVENOUS
  Filled 2015-01-22: qty 1250

## 2015-01-22 MED ORDER — CHLORHEXIDINE GLUCONATE 4 % EX LIQD: 60.0000 mL | Freq: Once | CUTANEOUS | Status: DC

## 2015-01-22 MED ORDER — LACTATED RINGERS IV SOLN: INTRAVENOUS | Status: DC

## 2015-01-22 MED ORDER — TRANEXAMIC ACID 1000 MG/10ML IV SOLN
1000.0000 mg | INTRAVENOUS | Status: AC
  Administered 2015-01-23: 1000 mg via INTRAVENOUS
  Filled 2015-01-22: qty 10

## 2015-01-23 ENCOUNTER — Encounter (HOSPITAL_COMMUNITY): Payer: Self-pay | Admitting: *Deleted

## 2015-01-23 ENCOUNTER — Inpatient Hospital Stay (HOSPITAL_COMMUNITY): Payer: Medicare Other | Admitting: Anesthesiology

## 2015-01-23 ENCOUNTER — Encounter (HOSPITAL_COMMUNITY)
Admission: RE | Disposition: A | Discharge status: Home Health | Payer: Self-pay | Source: Ambulatory Visit | Attending: Orthopedic Surgery

## 2015-01-23 ENCOUNTER — Inpatient Hospital Stay (HOSPITAL_COMMUNITY)
Admission: RE | Admit: 2015-01-23 | Discharge: 2015-01-25 | DRG: 470 | Disposition: A | Discharge status: Home Health | Payer: Medicare Other | Source: Ambulatory Visit | Attending: Orthopedic Surgery | Admitting: Orthopedic Surgery

## 2015-01-23 ENCOUNTER — Inpatient Hospital Stay (HOSPITAL_COMMUNITY): Payer: Medicare Other

## 2015-01-23 DIAGNOSIS — R011 Cardiac murmur, unspecified: Secondary | ICD-10-CM | POA: Diagnosis present

## 2015-01-23 DIAGNOSIS — E039 Hypothyroidism, unspecified: Secondary | ICD-10-CM | POA: Diagnosis present

## 2015-01-23 DIAGNOSIS — Z96651 Presence of right artificial knee joint: Secondary | ICD-10-CM | POA: Diagnosis not present

## 2015-01-23 DIAGNOSIS — I059 Rheumatic mitral valve disease, unspecified: Secondary | ICD-10-CM | POA: Diagnosis present

## 2015-01-23 DIAGNOSIS — Z471 Aftercare following joint replacement surgery: Secondary | ICD-10-CM | POA: Diagnosis not present

## 2015-01-23 DIAGNOSIS — I10 Essential (primary) hypertension: Secondary | ICD-10-CM | POA: Diagnosis present

## 2015-01-23 DIAGNOSIS — M1710 Unilateral primary osteoarthritis, unspecified knee: Secondary | ICD-10-CM | POA: Diagnosis present

## 2015-01-23 DIAGNOSIS — M179 Osteoarthritis of knee, unspecified: Secondary | ICD-10-CM | POA: Diagnosis present

## 2015-01-23 DIAGNOSIS — M1711 Unilateral primary osteoarthritis, right knee: Secondary | ICD-10-CM | POA: Diagnosis not present

## 2015-01-23 DIAGNOSIS — Z96659 Presence of unspecified artificial knee joint: Secondary | ICD-10-CM

## 2015-01-23 DIAGNOSIS — Z853 Personal history of malignant neoplasm of breast: Secondary | ICD-10-CM | POA: Diagnosis not present

## 2015-01-23 DIAGNOSIS — Z9011 Acquired absence of right breast and nipple: Secondary | ICD-10-CM | POA: Diagnosis not present

## 2015-01-23 DIAGNOSIS — D62 Acute posthemorrhagic anemia: Secondary | ICD-10-CM | POA: Diagnosis not present

## 2015-01-23 HISTORY — PX: TOTAL KNEE ARTHROPLASTY: SHX125

## 2015-01-23 SURGERY — ARTHROPLASTY, KNEE, TOTAL
Anesthesia: General | Laterality: Right

## 2015-01-23 MED ORDER — ONDANSETRON HCL 4 MG/2ML IJ SOLN
INTRAMUSCULAR | Status: DC | PRN
  Administered 2015-01-23: 4 mg via INTRAVENOUS

## 2015-01-23 MED ORDER — TRAZODONE HCL 100 MG PO TABS
200.0000 mg | ORAL_TABLET | Freq: Every day | ORAL | Status: DC
  Administered 2015-01-23 – 2015-01-24 (×2): 200 mg via ORAL
  Filled 2015-01-23 (×2): qty 2

## 2015-01-23 MED ORDER — BUPIVACAINE HCL (PF) 0.5 % IJ SOLN
INTRAMUSCULAR | Status: AC
  Filled 2015-01-23: qty 30

## 2015-01-23 MED ORDER — DOCUSATE SODIUM 100 MG PO CAPS
100.0000 mg | ORAL_CAPSULE | Freq: Two times a day (BID) | ORAL | Status: DC
  Administered 2015-01-23 – 2015-01-25 (×4): 100 mg via ORAL
  Filled 2015-01-23 (×4): qty 1

## 2015-01-23 MED ORDER — MENTHOL 3 MG MT LOZG: 1.0000 | LOZENGE | OROMUCOSAL | Status: DC | PRN

## 2015-01-23 MED ORDER — ONDANSETRON HCL 4 MG/2ML IJ SOLN: 4.0000 mg | Freq: Four times a day (QID) | INTRAMUSCULAR | Status: DC | PRN

## 2015-01-23 MED ORDER — METOCLOPRAMIDE HCL 5 MG/ML IJ SOLN: 5.0000 mg | Freq: Three times a day (TID) | INTRAMUSCULAR | Status: DC | PRN

## 2015-01-23 MED ORDER — ACETAMINOPHEN 325 MG PO TABS: 650.0000 mg | ORAL_TABLET | Freq: Four times a day (QID) | ORAL | Status: DC | PRN

## 2015-01-23 MED ORDER — LIDOCAINE HCL (CARDIAC) 20 MG/ML IV SOLN
INTRAVENOUS | Status: DC | PRN
  Administered 2015-01-23: 80 mg via INTRAVENOUS

## 2015-01-23 MED ORDER — ACETAMINOPHEN 650 MG RE SUPP: 650.0000 mg | Freq: Four times a day (QID) | RECTAL | Status: DC | PRN

## 2015-01-23 MED ORDER — SENNOSIDES-DOCUSATE SODIUM 8.6-50 MG PO TABS: 1.0000 | ORAL_TABLET | Freq: Every evening | ORAL | Status: DC | PRN

## 2015-01-23 MED ORDER — FENTANYL CITRATE (PF) 100 MCG/2ML IJ SOLN
INTRAMUSCULAR | Status: DC | PRN
  Administered 2015-01-23 (×2): 50 ug via INTRAVENOUS
  Administered 2015-01-23: 100 ug via INTRAVENOUS
  Administered 2015-01-23 (×4): 50 ug via INTRAVENOUS

## 2015-01-23 MED ORDER — FENTANYL CITRATE (PF) 100 MCG/2ML IJ SOLN
INTRAMUSCULAR | Status: AC
  Filled 2015-01-23: qty 2

## 2015-01-23 MED ORDER — SODIUM CHLORIDE 0.9 % IR SOLN
Status: DC | PRN
  Administered 2015-01-23: 3000 mL

## 2015-01-23 MED ORDER — OXYCODONE HCL 5 MG PO TABS
ORAL_TABLET | ORAL | Status: AC
  Filled 2015-01-23: qty 2

## 2015-01-23 MED ORDER — FENTANYL CITRATE (PF) 250 MCG/5ML IJ SOLN
INTRAMUSCULAR | Status: AC
  Filled 2015-01-23: qty 5

## 2015-01-23 MED ORDER — DEXTROSE 5 % IV SOLN
500.0000 mg | Freq: Four times a day (QID) | INTRAVENOUS | Status: DC | PRN
  Administered 2015-01-23: 500 mg via INTRAVENOUS
  Filled 2015-01-23 (×2): qty 5

## 2015-01-23 MED ORDER — LACTATED RINGERS IV SOLN
INTRAVENOUS | Status: DC
  Administered 2015-01-23: 10:00:00 via INTRAVENOUS

## 2015-01-23 MED ORDER — IRBESARTAN 150 MG PO TABS
150.0000 mg | ORAL_TABLET | Freq: Every day | ORAL | Status: DC
  Administered 2015-01-23 – 2015-01-25 (×3): 150 mg via ORAL
  Filled 2015-01-23 (×3): qty 1

## 2015-01-23 MED ORDER — BISACODYL 5 MG PO TBEC: 5.0000 mg | DELAYED_RELEASE_TABLET | Freq: Every day | ORAL | Status: DC | PRN

## 2015-01-23 MED ORDER — APIXABAN 2.5 MG PO TABS: ORAL_TABLET | ORAL | Status: DC

## 2015-01-23 MED ORDER — PROPOFOL 10 MG/ML IV BOLUS
INTRAVENOUS | Status: DC | PRN
  Administered 2015-01-23: 120 mg via INTRAVENOUS

## 2015-01-23 MED ORDER — MAGNESIUM CITRATE PO SOLN: 1.0000 | Freq: Once | ORAL | Status: DC | PRN

## 2015-01-23 MED ORDER — OXYCODONE-ACETAMINOPHEN 5-325 MG PO TABS: 1.0000 | ORAL_TABLET | ORAL | Status: DC | PRN

## 2015-01-23 MED ORDER — HYDROCHLOROTHIAZIDE 12.5 MG PO CAPS: 12.5000 mg | ORAL_CAPSULE | Freq: Every day | ORAL | Status: DC

## 2015-01-23 MED ORDER — HYDROMORPHONE HCL 1 MG/ML IJ SOLN
0.2500 mg | INTRAMUSCULAR | Status: DC | PRN
  Administered 2015-01-23 (×3): 0.5 mg via INTRAVENOUS

## 2015-01-23 MED ORDER — HYDROMORPHONE HCL 1 MG/ML IJ SOLN
INTRAMUSCULAR | Status: AC
  Filled 2015-01-23: qty 1

## 2015-01-23 MED ORDER — MIDAZOLAM HCL 2 MG/2ML IJ SOLN
INTRAMUSCULAR | Status: AC
  Filled 2015-01-23: qty 4

## 2015-01-23 MED ORDER — NEOSTIGMINE METHYLSULFATE 10 MG/10ML IV SOLN
INTRAVENOUS | Status: DC | PRN
  Administered 2015-01-23: 3 mg via INTRAVENOUS

## 2015-01-23 MED ORDER — BUPIVACAINE LIPOSOME 1.3 % IJ SUSP
INTRAMUSCULAR | Status: DC | PRN
  Administered 2015-01-23: 20 mL

## 2015-01-23 MED ORDER — METOCLOPRAMIDE HCL 5 MG PO TABS: 5.0000 mg | ORAL_TABLET | Freq: Three times a day (TID) | ORAL | Status: DC | PRN

## 2015-01-23 MED ORDER — ONDANSETRON HCL 4 MG/2ML IJ SOLN: 4.0000 mg | Freq: Once | INTRAMUSCULAR | Status: DC | PRN

## 2015-01-23 MED ORDER — BISACODYL 10 MG RE SUPP: 10.0000 mg | Freq: Every day | RECTAL | Status: DC | PRN

## 2015-01-23 MED ORDER — APIXABAN 2.5 MG PO TABS
2.5000 mg | ORAL_TABLET | Freq: Two times a day (BID) | ORAL | Status: DC
Start: 2015-01-24 — End: 2015-01-25
  Administered 2015-01-24 – 2015-01-25 (×3): 2.5 mg via ORAL
  Filled 2015-01-23 (×3): qty 1

## 2015-01-23 MED ORDER — ROCURONIUM BROMIDE 100 MG/10ML IV SOLN
INTRAVENOUS | Status: DC | PRN
  Administered 2015-01-23: 40 mg via INTRAVENOUS

## 2015-01-23 MED ORDER — ALUM & MAG HYDROXIDE-SIMETH 200-200-20 MG/5ML PO SUSP: 30.0000 mL | ORAL | Status: DC | PRN

## 2015-01-23 MED ORDER — LEVOTHYROXINE SODIUM 50 MCG PO TABS
50.0000 ug | ORAL_TABLET | Freq: Every day | ORAL | Status: DC
  Administered 2015-01-24 – 2015-01-25 (×2): 50 ug via ORAL
  Filled 2015-01-23 (×2): qty 1

## 2015-01-23 MED ORDER — LEVOTHYROXINE SODIUM 50 MCG PO TABS: 50.0000 ug | ORAL_TABLET | Freq: Every day | ORAL | Status: DC

## 2015-01-23 MED ORDER — GLYCOPYRROLATE 0.2 MG/ML IJ SOLN
INTRAMUSCULAR | Status: DC | PRN
  Administered 2015-01-23: 0.4 mg via INTRAVENOUS

## 2015-01-23 MED ORDER — ONDANSETRON HCL 4 MG PO TABS: 4.0000 mg | ORAL_TABLET | Freq: Four times a day (QID) | ORAL | Status: DC | PRN

## 2015-01-23 MED ORDER — PHENYLEPHRINE HCL 10 MG/ML IJ SOLN
INTRAMUSCULAR | Status: DC | PRN
  Administered 2015-01-23: 40 ug via INTRAVENOUS
  Administered 2015-01-23: 80 ug via INTRAVENOUS

## 2015-01-23 MED ORDER — BUPIVACAINE LIPOSOME 1.3 % IJ SUSP
20.0000 mL | Freq: Once | INTRAMUSCULAR | Status: DC
  Filled 2015-01-23: qty 20

## 2015-01-23 MED ORDER — HYDROMORPHONE HCL 1 MG/ML IJ SOLN
0.5000 mg | INTRAMUSCULAR | Status: DC | PRN
  Administered 2015-01-23 – 2015-01-24 (×4): 1 mg via INTRAVENOUS
  Filled 2015-01-23 (×6): qty 1

## 2015-01-23 MED ORDER — VANCOMYCIN HCL IN DEXTROSE 1-5 GM/200ML-% IV SOLN
1000.0000 mg | Freq: Two times a day (BID) | INTRAVENOUS | Status: AC
  Administered 2015-01-23: 1000 mg via INTRAVENOUS
  Filled 2015-01-23: qty 200

## 2015-01-23 MED ORDER — OXYCODONE HCL 5 MG PO TABS
5.0000 mg | ORAL_TABLET | ORAL | Status: DC | PRN
  Administered 2015-01-23: 5 mg via ORAL
  Administered 2015-01-23 – 2015-01-24 (×4): 10 mg via ORAL
  Filled 2015-01-23 (×4): qty 2

## 2015-01-23 MED ORDER — POTASSIUM CHLORIDE IN NACL 20-0.9 MEQ/L-% IV SOLN
INTRAVENOUS | Status: DC
  Administered 2015-01-23: 17:00:00 via INTRAVENOUS
  Filled 2015-01-23 (×2): qty 1000

## 2015-01-23 MED ORDER — FENTANYL CITRATE (PF) 100 MCG/2ML IJ SOLN
25.0000 ug | INTRAMUSCULAR | Status: DC | PRN
  Administered 2015-01-23 (×3): 50 ug via INTRAVENOUS

## 2015-01-23 MED ORDER — POTASSIUM CHLORIDE CRYS ER 20 MEQ PO TBCR: 10.0000 meq | EXTENDED_RELEASE_TABLET | Freq: Every day | ORAL | Status: DC

## 2015-01-23 MED ORDER — DIPHENHYDRAMINE HCL 12.5 MG/5ML PO ELIX: 12.5000 mg | ORAL_SOLUTION | ORAL | Status: DC | PRN

## 2015-01-23 MED ORDER — GABAPENTIN 300 MG PO CAPS
300.0000 mg | ORAL_CAPSULE | Freq: Every day | ORAL | Status: DC
  Administered 2015-01-23 – 2015-01-24 (×2): 300 mg via ORAL
  Filled 2015-01-23 (×2): qty 1

## 2015-01-23 MED ORDER — BUPIVACAINE HCL 0.5 % IJ SOLN
INTRAMUSCULAR | Status: DC | PRN
  Administered 2015-01-23: 20 mL

## 2015-01-23 MED ORDER — LACTATED RINGERS IV SOLN
INTRAVENOUS | Status: DC | PRN
  Administered 2015-01-23 (×2): via INTRAVENOUS

## 2015-01-23 MED ORDER — PHENOL 1.4 % MT LIQD: 1.0000 | OROMUCOSAL | Status: DC | PRN

## 2015-01-23 MED ORDER — DEXAMETHASONE SODIUM PHOSPHATE 10 MG/ML IJ SOLN
10.0000 mg | Freq: Once | INTRAMUSCULAR | Status: AC
  Administered 2015-01-24: 10 mg via INTRAVENOUS
  Filled 2015-01-23: qty 1

## 2015-01-23 MED ORDER — ONDANSETRON HCL 4 MG PO TABS: 4.0000 mg | ORAL_TABLET | Freq: Three times a day (TID) | ORAL | Status: DC | PRN

## 2015-01-23 MED ORDER — METHOCARBAMOL 500 MG PO TABS
500.0000 mg | ORAL_TABLET | Freq: Four times a day (QID) | ORAL | Status: DC | PRN
Start: 2015-01-23 — End: 2015-01-25
  Administered 2015-01-24 – 2015-01-25 (×3): 500 mg via ORAL
  Filled 2015-01-23 (×3): qty 1

## 2015-01-23 MED ORDER — MIDAZOLAM HCL 5 MG/5ML IJ SOLN
INTRAMUSCULAR | Status: DC | PRN
  Administered 2015-01-23: 1 mg via INTRAVENOUS

## 2015-01-23 MED ORDER — LABETALOL HCL 5 MG/ML IV SOLN
INTRAVENOUS | Status: DC | PRN
  Administered 2015-01-23 (×2): 5 mg via INTRAVENOUS

## 2015-01-23 SURGICAL SUPPLY — 68 items
APL SKNCLS STERI-STRIP NONHPOA (GAUZE/BANDAGES/DRESSINGS) ×1
BANDAGE ELASTIC 4 VELCRO ST LF (GAUZE/BANDAGES/DRESSINGS) ×3 IMPLANT
BANDAGE ELASTIC 6 VELCRO ST LF (GAUZE/BANDAGES/DRESSINGS) ×3 IMPLANT
BANDAGE ESMARK 6X9 LF (GAUZE/BANDAGES/DRESSINGS) ×1 IMPLANT
BENZOIN TINCTURE PRP APPL 2/3 (GAUZE/BANDAGES/DRESSINGS) ×3 IMPLANT
BLADE SAG 18X100X1.27 (BLADE) ×6 IMPLANT
BNDG CMPR 9X6 STRL LF SNTH (GAUZE/BANDAGES/DRESSINGS) ×1
BNDG ESMARK 6X9 LF (GAUZE/BANDAGES/DRESSINGS) ×3
BOWL SMART MIX CTS (DISPOSABLE) ×3 IMPLANT
CAPT KNEE TOTAL 3 ×2 IMPLANT
CEMENT BONE SIMPLEX SPEEDSET (Cement) ×6 IMPLANT
CLOSURE WOUND 1/2 X4 (GAUZE/BANDAGES/DRESSINGS) ×1
COVER SURGICAL LIGHT HANDLE (MISCELLANEOUS) ×3 IMPLANT
CUFF TOURNIQUET SINGLE 34IN LL (TOURNIQUET CUFF) ×3 IMPLANT
DRAPE EXTREMITY T 121X128X90 (DRAPE) ×3 IMPLANT
DRAPE IMP U-DRAPE 54X76 (DRAPES) ×3 IMPLANT
DRAPE PROXIMA HALF (DRAPES) ×3 IMPLANT
DRAPE U-SHAPE 47X51 STRL (DRAPES) ×3 IMPLANT
DRSG PAD ABDOMINAL 8X10 ST (GAUZE/BANDAGES/DRESSINGS) ×3 IMPLANT
DURAPREP 26ML APPLICATOR (WOUND CARE) ×6 IMPLANT
ELECT CAUTERY BLADE 6.4 (BLADE) ×3 IMPLANT
ELECT REM PT RETURN 9FT ADLT (ELECTROSURGICAL) ×3
ELECTRODE REM PT RTRN 9FT ADLT (ELECTROSURGICAL) ×1 IMPLANT
EVACUATOR 1/8 PVC DRAIN (DRAIN) ×3 IMPLANT
FACESHIELD WRAPAROUND (MASK) ×6 IMPLANT
FACESHIELD WRAPAROUND OR TEAM (MASK) ×2 IMPLANT
GAUZE SPONGE 4X4 12PLY STRL (GAUZE/BANDAGES/DRESSINGS) ×3 IMPLANT
GLOVE BIOGEL PI IND STRL 7.0 (GLOVE) ×1 IMPLANT
GLOVE BIOGEL PI INDICATOR 7.0 (GLOVE) ×2
GLOVE ORTHO TXT STRL SZ7.5 (GLOVE) ×3 IMPLANT
GLOVE SURG ORTHO 7.0 STRL STRW (GLOVE) ×3 IMPLANT
GOWN STRL REUS W/ TWL LRG LVL3 (GOWN DISPOSABLE) ×2 IMPLANT
GOWN STRL REUS W/ TWL XL LVL3 (GOWN DISPOSABLE) ×1 IMPLANT
GOWN STRL REUS W/TWL LRG LVL3 (GOWN DISPOSABLE) ×6
GOWN STRL REUS W/TWL XL LVL3 (GOWN DISPOSABLE) ×3
HANDPIECE INTERPULSE COAX TIP (DISPOSABLE) ×3
IMMOBILIZER KNEE 22 UNIV (SOFTGOODS) ×3 IMPLANT
IMMOBILIZER KNEE 24 THIGH 36 (MISCELLANEOUS) IMPLANT
IMMOBILIZER KNEE 24 UNIV (MISCELLANEOUS)
KIT BASIN OR (CUSTOM PROCEDURE TRAY) ×3 IMPLANT
KIT ROOM TURNOVER OR (KITS) ×3 IMPLANT
MANIFOLD NEPTUNE II (INSTRUMENTS) ×3 IMPLANT
NDL 18GX1X1/2 (RX/OR ONLY) (NEEDLE) ×1 IMPLANT
NDL HYPO 25GX1X1/2 BEV (NEEDLE) ×1 IMPLANT
NEEDLE 18GX1X1/2 (RX/OR ONLY) (NEEDLE) ×3 IMPLANT
NEEDLE HYPO 25GX1X1/2 BEV (NEEDLE) ×3 IMPLANT
NS IRRIG 1000ML POUR BTL (IV SOLUTION) ×3 IMPLANT
PACK TOTAL JOINT (CUSTOM PROCEDURE TRAY) ×3 IMPLANT
PACK UNIVERSAL I (CUSTOM PROCEDURE TRAY) ×3 IMPLANT
PAD ARMBOARD 7.5X6 YLW CONV (MISCELLANEOUS) ×6 IMPLANT
PAD CAST 4YDX4 CTTN HI CHSV (CAST SUPPLIES) ×1 IMPLANT
PADDING CAST COTTON 4X4 STRL (CAST SUPPLIES) ×3
SET HNDPC FAN SPRY TIP SCT (DISPOSABLE) ×1 IMPLANT
SPONGE GAUZE 4X4 12PLY STER LF (GAUZE/BANDAGES/DRESSINGS) ×2 IMPLANT
STRIP CLOSURE SKIN 1/2X4 (GAUZE/BANDAGES/DRESSINGS) ×3 IMPLANT
SUCTION FRAZIER TIP 10 FR DISP (SUCTIONS) ×3 IMPLANT
SUT MNCRL AB 4-0 PS2 18 (SUTURE) ×3 IMPLANT
SUT VIC AB 0 CT1 27 (SUTURE)
SUT VIC AB 0 CT1 27XBRD ANBCTR (SUTURE) IMPLANT
SUT VIC AB 1 CTX 36 (SUTURE) ×3
SUT VIC AB 1 CTX36XBRD ANBCTR (SUTURE) ×1 IMPLANT
SUT VIC AB 2-0 CT1 27 (SUTURE) ×6
SUT VIC AB 2-0 CT1 TAPERPNT 27 (SUTURE) ×2 IMPLANT
SYR 50ML LL SCALE MARK (SYRINGE) ×3 IMPLANT
SYR CONTROL 10ML LL (SYRINGE) ×3 IMPLANT
TOWEL OR 17X24 6PK STRL BLUE (TOWEL DISPOSABLE) ×3 IMPLANT
TOWEL OR 17X26 10 PK STRL BLUE (TOWEL DISPOSABLE) ×3 IMPLANT
WATER STERILE IRR 1000ML POUR (IV SOLUTION) ×1 IMPLANT

## 2015-01-23 NOTE — Anesthesia Procedure Notes (Signed)
Procedure Name: Intubation Date/Time: 01/23/2015 11:54 AM Performed by: Layla Maw Pre-anesthesia Checklist: Patient identified, Patient being monitored, Timeout performed, Emergency Drugs available and Suction available Patient Re-evaluated:Patient Re-evaluated prior to inductionOxygen Delivery Method: Circle System Utilized Preoxygenation: Pre-oxygenation with 100% oxygen Intubation Type: IV induction Ventilation: Mask ventilation without difficulty Laryngoscope Size: Mac and 4 Grade View: Grade I Tube type: Oral Tube size: 7.5 mm Number of attempts: 1 Airway Equipment and Method: Stylet Placement Confirmation: ETT inserted through vocal cords under direct vision,  positive ETCO2 and breath sounds checked- equal and bilateral Secured at: 21 cm Tube secured with: Tape Dental Injury: Teeth and Oropharynx as per pre-operative assessment  Comments: Done by Dr. Lauretta Grill and [facility] student, Peter Congo

## 2015-01-23 NOTE — Interval H&P Note (Signed)
History and Physical Interval Note:  01/23/2015 8:28 AM  Colleen [name]  has presented today for surgery, with the diagnosis of djd right knee  The various methods of treatment have been discussed with the patient and family. After consideration of risks, benefits and other options for treatment, the patient has consented to  Procedure(s): TOTAL KNEE ARTHROPLASTY (Right) as a surgical intervention .  The patient's history has been reviewed, patient examined, no change in status, stable for surgery.  I have reviewed the patient's chart and labs.  Questions were answered to the patient's satisfaction.     [name]

## 2015-01-23 NOTE — Progress Notes (Signed)
Orthopedic Tech Progress Note Patient Details:  Colleen [name] 1934/07/03 [id number] Off cpm at 7:00 pm Patient ID: [name], female   DOB: 03/09/1935, 80 y.o.   MRN: [id number]   Braulio Bosch 01/23/2015, 6:59 PM

## 2015-01-23 NOTE — H&P (View-Only) (Signed)
TOTAL KNEE ADMISSION H&P  Patient is being admitted for right total knee arthroplasty.  Subjective:  Chief Complaint:right knee pain.  HPI: Colleen [name], 80 y.o. female, has a history of pain and functional disability in the right knee due to arthritis and has failed non-surgical conservative treatments for greater than 12 weeks to includeNSAID's and/or analgesics, corticosteriod injections, viscosupplementation injections, use of assistive devices and activity modification.  Onset of symptoms was gradual, starting 2 years ago with rapidlly worsening course since that time. The patient noted prior procedures on the knee to include  arthroscopy and menisectomy on the right knee(s).  Patient currently rates pain in the right knee(s) at 8 out of 10 with activity. Patient has night pain, worsening of pain with activity and weight bearing, pain that interferes with activities of daily living, pain with passive range of motion, crepitus and joint swelling.  Patient has evidence of subchondral sclerosis and joint space narrowing by imaging studies. There is no active infection.  There are no active problems to display for this patient.  No past medical history on file.  No past surgical history on file.   (Not in a hospital admission) Not on File  Social History  Substance Use Topics  . Smoking status: Not on file  . Smokeless tobacco: Not on file  . Alcohol Use: Not on file    No family history on file.   Review of Systems  Constitutional: Negative.   HENT: Negative.   Eyes: Negative.   Respiratory: Negative.   Cardiovascular: Negative.   Gastrointestinal: Negative.   Genitourinary: Negative.   Musculoskeletal: Positive for joint pain.  Skin: Negative.   Neurological: Negative.   Endo/Heme/Allergies: Negative.   Psychiatric/Behavioral: Negative.     Objective:  Physical Exam  Constitutional: She is oriented to person, place, and time. She appears well-developed and  well-nourished.  HENT:  Head: Normocephalic and atraumatic.  Eyes: EOM are normal. Pupils are equal, round, and reactive to light.  Neck: Normal range of motion. Neck supple.  Cardiovascular: Normal rate and regular rhythm.  Exam reveals no friction rub.   No murmur heard. Respiratory: Effort normal and breath sounds normal.  GI: Soft. Bowel sounds are normal.  Neurological: She is alert and oriented to person, place, and time.  Skin: Skin is warm and dry.  Psychiatric: She has a normal mood and affect. Her behavior is normal. Judgment and thought content normal.    Vital signs in last 24 hours: @VSRANGES @  Labs:   There is no height or weight on file to calculate BMI.   Imaging Review Plain radiographs demonstrate severe degenerative joint disease of the right knee(s). The overall alignment ismild valgus. The bone quality appears to be fair for age and reported activity level.  Assessment/Plan:  End stage arthritis, right knee   The patient history, physical examination, clinical judgment of the provider and imaging studies are consistent with end stage degenerative joint disease of the right knee(s) and total knee arthroplasty is deemed medically necessary. The treatment options including medical management, injection therapy arthroscopy and arthroplasty were discussed at length. The risks and benefits of total knee arthroplasty were presented and reviewed. The risks due to aseptic loosening, infection, stiffness, patella tracking problems, thromboembolic complications and other imponderables were discussed. The patient acknowledged the explanation, agreed to proceed with the plan and consent was signed. Patient is being admitted for inpatient treatment for surgery, pain control, PT, OT, prophylactic antibiotics, VTE prophylaxis, progressive ambulation and ADL's and discharge planning.  The patient is planning to be discharged home with home health services

## 2015-01-23 NOTE — Progress Notes (Signed)
Orthopedic Tech Progress Note Patient Details:  Colleen [name] April 11, 1934 [id number]  CPM Right Knee CPM Right Knee: On Right Knee Flexion (Degrees): 90 Right Knee Extension (Degrees): 0 Additional Comments: trapeze bar patiwent helper viewed order from doctor's order list  Colleen [name] 01/23/2015, 2:14 PM

## 2015-01-23 NOTE — Anesthesia Preprocedure Evaluation (Addendum)
Anesthesia Evaluation  Patient identified by MRN, date of birth, ID band Patient awake    Reviewed: Allergy & Precautions, NPO status , Patient's Chart, lab work & pertinent test results  History of Anesthesia Complications Negative for: history of anesthetic complications  Airway Mallampati: II  TM Distance: >3 FB Neck ROM: Full    Dental no notable dental hx. (+) Dental Advisory Given   Pulmonary neg pulmonary ROS,    Pulmonary exam normal breath sounds clear to auscultation       Cardiovascular hypertension, Pt. on medications Normal cardiovascular exam+ Valvular Problems/Murmurs  Rhythm:Regular Rate:Normal  Last echo reviewed 2012 from primary care doctor, clearance given   Neuro/Psych  Headaches, negative psych ROS   GI/Hepatic negative GI ROS, Neg liver ROS,   Endo/Other  negative endocrine ROSHypothyroidism   Renal/GU negative Renal ROS  negative genitourinary   Musculoskeletal  (+) Arthritis ,   Abdominal   Peds negative pediatric ROS (+)  Hematology negative hematology ROS (+)   Anesthesia Other Findings   Reproductive/Obstetrics negative OB ROS                            Anesthesia Physical Anesthesia Plan  ASA: II  Anesthesia Plan: General   Post-op Pain Management:    Induction: Intravenous  Airway Management Planned: Oral ETT  Additional Equipment:   Intra-op Plan:   Post-operative Plan: Extubation in OR  Informed Consent: I have reviewed the patients History and Physical, chart, labs and discussed the procedure including the risks, benefits and alternatives for the proposed anesthesia with the patient or authorized representative who has indicated his/her understanding and acceptance.   Dental advisory given  Plan Discussed with: CRNA  Anesthesia Plan Comments:         Anesthesia Quick Evaluation

## 2015-01-23 NOTE — Discharge Summary (Addendum)
Patient ID: [name] MRN: [id number] DOB/AGE: [birth year] 80 y.o.  Admit date: 01/23/2015 Discharge date: 01/25/2015  Admission Diagnoses:  Active Problems:   DJD (degenerative joint disease) of knee   Discharge Diagnoses:  Same  Past Medical History  Diagnosis Date  . Hypertension   . Heart murmur   . Mitral valve disorder   . Hypothyroidism   . Headache   . Arthritis   . Cancer [facility])     rt breast    Surgeries: Procedure(s): TOTAL KNEE ARTHROPLASTY on 01/23/2015   Consultants:    Discharged Condition: Improved  Hospital Course: Colleen [name] is an 80 y.o. female who was admitted 01/23/2015 for operative treatment of primary localized osteoarthritis right knee. Patient has severe unremitting pain that affects sleep, daily activities, and work/hobbies. After pre-op clearance the patient was taken to the operating room on 01/23/2015 and underwent  Procedure(s): TOTAL KNEE ARTHROPLASTY.  Patient with a pre-op Hb of 13.3 developed ABLA on pod #1 with a Hb of 10.6 and 9.8 on pod#2. She is currently stable but we will continue to follow.  Patient was given perioperative antibiotics:      Anti-infectives    Start     Dose/Rate Route Frequency Ordered Stop   01/23/15 1700  vancomycin (VANCOCIN) IVPB 1000 mg/200 mL premix     1,000 mg 200 mL/hr over 60 Minutes Intravenous Every 12 hours 01/23/15 1630 01/23/15 1834   01/23/15 1100  vancomycin (VANCOCIN) 1,250 mg in sodium chloride 0.9 % 250 mL IVPB     1,250 mg 166.7 mL/hr over 90 Minutes Intravenous To ShortStay Surgical 01/22/15 1205 01/23/15 1109       Patient was given sequential compression devices, early ambulation, and chemoprophylaxis to prevent DVT.  Patient benefited maximally from hospital stay and there were no complications.    Recent vital signs:  Patient Vitals for the past 24 hrs:  BP Temp Temp src Pulse Resp SpO2  01/25/15 0552 (!) 127/58 mmHg 100.1 F (37.8 C) Oral (!) 103 16 96 %  01/24/15 2208 (!)  120/58 mmHg 99.9 F (37.7 C) Oral (!) 107 16 92 %  01/24/15 1345 (!) 142/71 mmHg 98.1 F (36.7 C) - 86 - 97 %  01/24/15 0948 (!) 125/56 mmHg - - 85 - 97 %     Recent laboratory studies:   Recent Labs  01/24/15 0742 01/25/15 0440  WBC 7.7 9.7  HGB 10.6* 9.8*  HCT 33.1* 29.6*  PLT 239 224  NA 131* 137  K 3.5 3.7  CL 97* 100*  CO2 26 28  BUN <5* 7  CREATININE 0.61 0.56  GLUCOSE 124* 123*  CALCIUM 8.2* 8.4*     Discharge Medications:     Medication List    STOP taking these medications        HYDROcodone-acetaminophen 10-325 MG tablet  Commonly known as:  NORCO      TAKE these medications        apixaban 2.5 MG Tabs tablet  Commonly known as:  ELIQUIS  Take Eliquis as directed for a total of 14 days following surgery to prevent blood clots.     BENICAR 20 MG tablet  Generic drug:  olmesartan  Take 1 tablet by mouth daily.     bisacodyl 5 MG EC tablet  Commonly known as:  DULCOLAX  Take 1 tablet (5 mg total) by mouth daily as needed for moderate constipation.     cetirizine 10 MG tablet  Commonly known as:  ZYRTEC  Take 10 mg by mouth daily.     gabapentin 300 MG capsule  Commonly known as:  NEURONTIN  Take 1 capsule by mouth at bedtime.     guaiFENesin 600 MG 12 hr tablet  Commonly known as:  MUCINEX  Take 600 mg by mouth as needed (1 tablet).     hydrochlorothiazide 12.5 MG capsule  Commonly known as:  MICROZIDE  Take 1 capsule by mouth daily.     levothyroxine 50 MCG tablet  Commonly known as:  SYNTHROID, LEVOTHROID  Take 1 tablet by mouth daily.     ondansetron 4 MG tablet  Commonly known as:  ZOFRAN  Take 1 tablet (4 mg total) by mouth every 8 (eight) hours as needed for nausea or vomiting.     oxyCODONE-acetaminophen 5-325 MG tablet  Commonly known as:  ROXICET  Take 1-2 tablets by mouth every 4 (four) hours as needed.     potassium chloride 10 MEQ tablet  Commonly known as:  K-DUR,KLOR-CON  Take 10 mEq by mouth daily.     traZODone  100 MG tablet  Commonly known as:  DESYREL  Take 2 tablets by mouth at bedtime.        Diagnostic Studies: Dg Knee Right Port  01/23/2015  CLINICAL DATA:  Status post right-sided knee replacement EXAM: PORTABLE RIGHT KNEE - 1-2 VIEW COMPARISON:  None in PACs FINDINGS: AP and lateral portable views of the right knee reveal total knee joint prosthesis placement. Radiographic positioning of the prosthetic components is good. There is soft tissue gas present. IMPRESSION: Status post placement of right total right knee joint replacement without evidence of immediate complication. Electronically Signed   By: David  Martinique M.D.   On: 01/23/2015 13:52    Disposition: Final discharge disposition not confirmed    Follow-up Information    Follow up with [name], MD. Schedule an appointment as soon as possible for a visit in 2 weeks.   Specialty:  Orthopedic Surgery   Contact information:   [address] [place] [address] [phone]        Signed: [name] 01/25/2015, 7:26 AM

## 2015-01-23 NOTE — Progress Notes (Signed)
Utilization review completed.  

## 2015-01-23 NOTE — Transfer of Care (Signed)
Immediate Anesthesia Transfer of Care Note  Patient: Colleen [name]  Procedure(s) Performed: Procedure(s): TOTAL KNEE ARTHROPLASTY (Right)  Patient Location: PACU  Anesthesia Type:General  Level of Consciousness: awake, alert , oriented and patient cooperative  Airway & Oxygen Therapy: Patient Spontanous Breathing and Patient connected to nasal cannula oxygen  Post-op Assessment: Report given to RN and Post -op Vital signs reviewed and stable  Post vital signs: Reviewed and stable  Last Vitals:  Filed Vitals:   01/23/15 0905  BP: 167/68  Pulse: 89  Temp: 36.6 C  Resp: 18    Complications: No apparent anesthesia complications

## 2015-01-24 ENCOUNTER — Encounter (HOSPITAL_COMMUNITY): Payer: Self-pay | Admitting: Orthopedic Surgery

## 2015-01-24 LAB — BASIC METABOLIC PANEL
ANION GAP: 8 (ref 5–15)
BUN: <5 mg/dL — ABNORMAL LOW (ref 6–20)
CHLORIDE: 97 mmol/L — AB (ref 101–111)
CO2: 26 mmol/L (ref 22–32)
CREATININE: 0.61 mg/dL (ref 0.44–1.00)
Calcium: 8.2 mg/dL — ABNORMAL LOW (ref 8.9–10.3)
GFR calc non Af Amer: >60 mL/min (ref >60)
Glucose, Bld: 124 mg/dL — ABNORMAL HIGH (ref 65–99)
POTASSIUM: 3.5 mmol/L (ref 3.5–5.1)
SODIUM: 131 mmol/L — AB (ref 135–145)

## 2015-01-24 LAB — CBC
HEMATOCRIT: 33.1 % — AB (ref 36.0–46.0)
HEMOGLOBIN: 10.6 g/dL — AB (ref 12.0–15.0)
MCH: 28.8 pg (ref 26.0–34.0)
MCHC: 32 g/dL (ref 30.0–36.0)
MCV: 89.9 fL (ref 78.0–100.0)
Platelets: 239 10*3/uL (ref 150–400)
RBC: 3.68 MIL/uL — AB (ref 3.87–5.11)
RDW: 13.8 % (ref 11.5–15.5)
WBC: 7.7 10*3/uL (ref 4.0–10.5)

## 2015-01-24 MED ORDER — MORPHINE SULFATE 15 MG PO TABS
15.0000 mg | ORAL_TABLET | ORAL | Status: DC | PRN
  Administered 2015-01-24 – 2015-01-25 (×3): 15 mg via ORAL
  Filled 2015-01-24 (×3): qty 1

## 2015-01-24 NOTE — Evaluation (Signed)
Occupational Therapy Evaluation Patient Details Name: Colleen [name] MRN: [id number] DOB: 11/13/1934 Today's Date: 01/24/2015    History of Present Illness 80 y.o. female admitted to [facility] on 01/24/15 for elective R TKA. Pt with significant PMHx of HTN, hear murmur, mitral valve disorder, HA, R breast CA with R breast mastectomy, cervical fusion, back surgery, and right carpal tunnel release.      Clinical Impression   .Pt admitted to hospital due to reason stated above. Pt currently with functional limitiations due to the deficits listed below (see OT problem list). Prior to admission pt was independent with ADLs. Pt currently requires min to total assistance for safety with ADLs. Session limited due to pt c/o pain ranking 10/10, pt repositioned, ice applied and NT notified of pt requesting pain medicine. Pt will benefit from skilled OT to increase her independence and safety with ADLs and balance to allow safe discharge home.    Follow Up Recommendations   Supervision-intermittent   Equipment Recommendations  None recommended by OT    Recommendations for Other Services       Precautions / Restrictions Precautions Precautions: Knee;Fall Precaution Comments: reviewed weight bearing status Restrictions Weight Bearing Restrictions: Yes      Mobility Bed Mobility Overal bed mobility: Needs Assistance Bed Mobility: Supine to Sit     Supine to sit: +2 for physical assistance;Mod assist;HOB elevated     General bed mobility comments: Pt required +2 assist for positioning Rt LE off of bed and support trunk into upright position. Pt able to move LLE off of bed to sit EOB. Pt requires constant encouragement to sit EOB  Transfers                 General transfer comment: Pt declined sit to stand transfer due to Rt knee pain of 10/10    Balance Overall balance assessment: Needs assistance Sitting-balance support: Feet supported;Single extremity supported Sitting  balance-Leahy Scale: Fair Sitting balance - Comments: Required UE support from bed rail, however believe UE support was used due to pain in Rt knee                                    ADL Overall ADL's : Needs assistance/impaired                                       General ADL Comments: Pt declined engaging in any ADLs this session due to 10/10 pain in Rt knee. However pt was able to get to EOB for precursor to ADL participation but could not tolerate increase pain in Rt Knee. Pt currently requiring min-total assistance for ADLs using clinical judgement.     Vision     Perception     Praxis      Pertinent Vitals/Pain Pain Assessment: 0-10 Pain Score: 10-Worst pain ever Pain Location: rt knee Pain Descriptors / Indicators: Aching;Grimacing;Guarding Pain Intervention(s): Repositioned;Monitored during session;Patient requesting pain meds-RN notified;Ice applied     Hand Dominance Right   Extremity/Trunk Assessment Upper Extremity Assessment Upper Extremity Assessment: Overall WFL for tasks assessed   Lower Extremity Assessment Lower Extremity Assessment: Defer to PT evaluation   Cervical / Trunk Assessment Cervical / Trunk Assessment: Other exceptions Cervical / Trunk Exceptions: hx of neck surgery   Communication Communication Communication: No difficulties   Cognition Arousal/Alertness: Awake/alert Behavior During  Therapy: Anxious Overall Cognitive Status: Within Functional Limits for tasks assessed                     General Comments    Pt's husband present during session. Pt expressed interest in [name] about adaptive equipment for ADL completion.    Exercises       Shoulder Instructions      Home Living Family/patient expects to be discharged to:: Private residence Living Arrangements: Spouse/significant other Available Help at Discharge: Family;Available 24 hours/day Type of Home: House Home Access: Stairs to  enter [employer] of Steps: 2 Entrance Stairs-Rails: Right;Left;Can reach both Home Layout: One level     Bathroom Shower/Tub: Tub/shower unit;Door   [employer] Toilet: Handicapped height     Home Equipment: Clinical cytogeneticist - 2 wheels;Bedside commode;Grab bars - tub/shower;Adaptive equipment (CPM at home) Adaptive Equipment: Other (Comment) (Leg lifter)        Prior Functioning/Environment Level of Independence: Independent             OT Diagnosis: Generalized weakness;Acute pain   OT Problem List: Decreased strength;Decreased activity tolerance;Impaired balance (sitting and/or standing);Decreased knowledge of use of DME or AE;Pain   OT Treatment/Interventions: Self-care/ADL training;Therapeutic exercise;DME and/or AE instruction;Therapeutic activities;Patient/family education;Balance training    OT Goals(Current goals can be found in the care plan section) Acute Rehab OT Goals Patient Stated Goal: to decrease pain OT Goal Formulation: With patient Time For Goal Achievement: 02/07/15 Potential to Achieve Goals: Good ADL Goals Pt Will Perform Grooming: with supervision;standing Pt Will Perform Lower Body Bathing: with supervision;with adaptive equipment;sit to/from stand Pt Will Perform Lower Body Dressing: with supervision;with adaptive equipment;sit to/from stand Pt Will Transfer to Toilet: with supervision;ambulating;regular height toilet Pt Will Perform Toileting - Clothing Manipulation and hygiene: with modified independence;sitting/lateral leans Pt Will Perform Tub/Shower Transfer: Tub transfer;with supervision;ambulating;rolling walker;shower seat Additional ADL Goal #1: Pt will be at mod I level for bed mobility  OT Frequency: Min 2X/week   Barriers to D/C:            Co-evaluation              End of Session Equipment Utilized During Treatment: Oxygen Nurse Communication: Patient requests pain meds  Activity Tolerance: Patient limited  by pain Patient left: in bed;with call bell/phone within reach;with family/visitor present   Time: 1300-1320 OT Time Calculation (min): 20 min Charges:  OT General Charges $OT Visit: 1 Procedure OT Evaluation $Initial OT Evaluation Tier I: 1 Procedure G-Codes:    Lin Landsman 02/22/15, 2:02 PM

## 2015-01-24 NOTE — Progress Notes (Signed)
Subjective: 1 Day Post-Op Procedure(s) (LRB): TOTAL [name] ARTHROPLASTY (Right) Patient reports pain as moderate.  Pain is the big issue this am.  Patient states she is on chronic pain meds (norco) for her back and that the oxycodone here is not helping.  She has taken morphine tabs following a remote back surgery which she states worked well.  Otherwise, no nausea/vomiting, chest pain/sob, lightheadedness/dizziness.  Positive flatus but no bm.  Objective: Vital signs in last 24 hours: Temp:  [97.7 F (36.5 C)-99.2 F (37.3 C)] 98.8 F (37.1 C) (11/10 0506) Pulse Rate:  [50-89] 72 (11/10 0506) Resp:  [10-26] 14 (11/10 0506) BP: (131-178)/(60-95) 133/73 mmHg (11/10 0506) SpO2:  [96 %-100 %] 99 % (11/10 0506) Weight:  [72.122 kg (159 lb)] 72.122 kg (159 lb) (11/09 0905)  Intake/Output from previous day: 11/09 0701 - 11/10 0700 In: 1305 [I.V.:1250; IV Piggyback:55] Out: 1200 [Urine:1200] Intake/Output this shift: Total I/O In: -  Out: 400 [Urine:400]  No results for input(s): HGB in the last 72 hours. No results for input(s): WBC, RBC, HCT, PLT in the last 72 hours. No results for input(s): NA, K, CL, CO2, BUN, CREATININE, GLUCOSE, CALCIUM in the last 72 hours. No results for input(s): LABPT, INR in the last 72 hours.  Neurologically intact Neurovascular intact Sensation intact distally Intact pulses distally Dorsiflexion/Plantar flexion intact Compartment soft  Assessment/Plan: 1 Day Post-Op Procedure(s) (LRB): TOTAL [name] ARTHROPLASTY (Right) Advance diet Up with therapy D/C IV fluids Discharge home with home health most likely not until tomorrow WBAT RLE Dry dressing change prn Will switch from oxycodone to morphine tabs  Colleen [name] 01/24/2015, 6:49 AM

## 2015-01-24 NOTE — Progress Notes (Signed)
01/24/15 0954  PT EVALUATION  Last PT Received On 01/24/15  Assistance Needed +2 (for chair to follow)  History of Present Illness 80 y.o. female admitted to [facility] on 01/24/15 for elective R TKA. Pt with significant PMHx of HTN, hear murmur, mitral valve disorder, HA, R breast CA with R breast mastectomy, cervical fusion, back surgery, and right carpal tunnel release.     Precautions  Precautions Knee;Fall  Precaution Booklet Issued Yes (comment)  Precaution Comments handout given  Home Living  Family/patient expects to be discharged to: Private residence  Living Arrangements Spouse/significant other  Available Help at Discharge Family;Available 24 hours/day  Type of Home House  Home Access Stairs to enter  Entrance Stairs-Number of Steps 2  Entrance Stairs-Rails Right;Left;Can reach both  Home Layout One level  Bathroom Shower/Tub Tub/shower unit;Door  Advertising account executive - 2 wheels;Other (comment) (CPM at home)  Prior Function  Level of Independence Independent with assistive device(s)  Comments has hused a walker "some"  Communication  Communication No difficulties  Pain Assessment  Pain Assessment 0-10  Pain Score 7  Pain Location right knee  Pain Descriptors / Indicators Aching;Burning  Pain Intervention(s) Limited activity within patient's tolerance;Monitored during session;Premedicated before session;Repositioned  Cognition  Arousal/Alertness Lethargic;Suspect due to medications  Behavior During Therapy [facility] for tasks assessed/performed  Overall Cognitive Status Within Functional Limits for tasks assessed  Upper Extremity Assessment  Upper Extremity Assessment Defer to OT evaluation  Lower Extremity Assessment  Lower Extremity Assessment RLE deficits/detail  RLE Deficits / Details right leg with normal post op pain and weakness.  ankle 3/5, knee 2/5, hip 2+/5  Cervical / Trunk Assessment  Cervical  / Trunk Assessment Other exceptions  Cervical / Trunk Exceptions h/o neck and low back surgery  Bed Mobility  Overal bed mobility Needs Assistance  Bed Mobility Supine to Sit  Supine to sit +2 for physical assistance;Mod assist  General bed mobility comments Two person mod assist to help progress right leg over the EOB and support trunk during transition to sitting.    Transfers  Overall transfer level Needs assistance  Equipment used Rolling walker (2 wheeled)  Transfers Sit to/from [employer] Transfers  Sit to Stand +2 safety/equipment;Min assist  Stand pivot transfers Min assist;+2 safety/equipment  General transfer comment Two person min assist to support trunk, stabilize RW and verbal cues for upright posture, hand placement and WBAT status of right leg.   Ambulation/Gait  Ambulation/Gait assistance +2 safety/equipment;Min assist  Ambulation Distance (Feet) 5 Feet  Assistive device Rolling walker (2 wheeled)  Gait Pattern/deviations Step-to pattern;Antalgic  General Gait Details Pt with one person assisting her at her trunk, one at chair to make sure chair did not need to be pulled up to patient.  Pt not really stepping as much as pivoting on her left leg.  TDWB at most on right leg during attempts at gait  Balance  Overall balance assessment Needs assistance  Sitting-balance support Feet supported;Bilateral upper extremity supported  Sitting balance-Leahy Scale Poor  Sitting balance - Comments pt needed min assist EOB to keep from losing balance posteriorly  Standing balance support Bilateral upper extremity supported  Standing balance-Leahy Scale Poor  Exercises  Exercises Total Joint  Total Joint Exercises  Ankle Circles/Pumps AROM;Both;20 reps;Seated  Quad Sets Other (comment) (attempted without towel roll)  Towel Squeeze AROM;Both;10 reps;Supine  Heel Slides AAROM;Right;10 reps;Supine  Goniometric ROM 23-35 (long sitting in recliner chair AROM)  PT - End of  Session  Equipment Utilized During Treatment Gait belt  Activity Tolerance Patient limited by pain  Patient left in chair;with call bell/phone within reach;with family/visitor present  PT Assessment  PT Therapy Diagnosis  Difficulty walking;Abnormality of gait;Generalized weakness;Acute pain  PT Recommendation/Assessment Patient needs continued PT services  PT Problem List Decreased strength;Decreased range of motion;Decreased activity tolerance;Decreased balance;Decreased mobility;Decreased knowledge of use of DME;Decreased knowledge of precautions;Pain  PT Plan  PT Frequency (ACUTE ONLY) 7X/week  PT Treatment/Interventions (ACUTE ONLY) DME instruction;Gait training;Stair training;Functional mobility training;Therapeutic activities;Therapeutic exercise;Balance training;Neuromuscular re-education;Patient/family education;Modalities;Manual techniques  PT Recommendation  Follow Up Recommendations Home health PT;Supervision for mobility/OOB  PT equipment None recommended by PT  Individuals Consulted  Consulted and Agree with Results and Recommendations Patient  Acute Rehab PT Goals  Patient Stated Goal to decrease pain  PT Goal Formulation With patient  Time For Goal Achievement 01/31/15  Potential to Achieve Goals Good  PT General Charges  $$ ACUTE PT VISIT 1 Procedure  PT Evaluation  $Initial PT Evaluation Tier I 1 Procedure  PT Treatments  $Therapeutic Activity 8-22 mins  Written Expression  Dominant Hand Right  Assessment: Pt is limited by pain during ambulating and only able to take pivotal steps to [facility] and recliner chair.  She and husband are hopeful to return home. She will need to make big progressions for me to feel this is safe at discharge.   PT to follow acutely for deficits listed below.     Barbarann Ehlers [place], [place], DPT [phone]

## 2015-01-24 NOTE — Progress Notes (Signed)
Physical Therapy Treatment Patient Details Name: Zakiyyah Livergood MRN: [id number] DOB: [birth year] Today's Date: 01/24/2015    History of Present Illness 80 y.o. female admitted to [facility] on 01/24/15 for elective R TKA. Pt with significant PMHx of HTN, hear murmur, mitral valve disorder, HA, R breast CA with R breast mastectomy, cervical fusion, back surgery, and right carpal tunnel release.       PT Comments    Pt tearful during treatment due to increased pain. Patient reluctant to walk however she was able to ambulate to the recliner and back to the bed. Pt required verbal cuing for sticking leg out while sitting down to decrease pain. However, pt was able to have functional knee flexion while sitting on bedside commode. Pt put back in bed with CPM on and family members present.   Follow Up Recommendations  Home health PT;Supervision for mobility/OOB     Equipment Recommendations  None recommended by PT       Precautions / Restrictions Precautions Precautions: Knee;Fall Precaution Comments: reviewed weight bearing status Restrictions Weight Bearing Restrictions: Yes Other Position/Activity Restrictions: WBAT    Mobility  Bed Mobility Overal bed mobility: Needs Assistance Bed Mobility: Supine to Sit     Supine to sit: Mod assist     General bed mobility comments: Mod assistance to bring RLE off the bed.   Transfers Overall transfer level: Needs assistance Equipment used: Rolling walker (2 wheeled) Transfers: Sit to/from Stand Sit to Stand: Mod assist;+2 safety/equipment         General transfer comment: Pt required mod assistance to stand from bed. Person infront of RW to stabilize.  Ambulation/Gait Ambulation/Gait assistance: Min assist;+2 safety/equipment Ambulation Distance (Feet): 10 Feet Assistive device: Rolling walker (2 wheeled) Gait Pattern/deviations: Step-to pattern;Decreased stance time - right;Antalgic;Trunk flexed Gait velocity: decreased Gait velocity  interpretation: <1.8 ft/sec, indicative of risk for recurrent falls General Gait Details: Pt required min assistance during gait with +2 for safety.             Cognition Arousal/Alertness: Awake/alert Behavior During Therapy: Anxious Overall Cognitive Status: Within Functional Limits for tasks assessed                      Exercises Total Joint Exercises Ankle Circles/Pumps: AROM;Both;20 reps;Seated Quad Sets: AROM;Right;10 reps;Seated (pt does not have much quad activation) Short Arc Quad: AROM;AAROM;Right;10 reps;Seated (unable to roll towel fully due to pain) Heel Slides: AROM;AAROM;10 reps;Right;Seated Straight Leg Raises: AAROM;Right;10 reps;Seated (most assistance from SPTA)        Pertinent Vitals/Pain Pain Assessment: 0-10 Pain Score: 7  Pain Location: right knee Pain Descriptors / Indicators: Aching;Grimacing;Moaning Pain Intervention(s): Monitored during session;Patient requesting pain meds-RN notified           PT Goals (current goals can now be found in the care plan section) Acute Rehab PT Goals Patient Stated Goal: to decrease pain    Frequency  7X/week    PT Plan Current plan remains appropriate       End of Session Equipment Utilized During Treatment: Gait belt (RW) Activity Tolerance: Patient limited by pain Patient left: in chair;with call bell/phone within reach;with family/visitor present     Time: 1343-1420 PT Time Calculation (min) (ACUTE ONLY): 37 min  Charges:    1 Gait [place], [place] OFFICE   01/24/2015, 2:40 PM

## 2015-01-24 NOTE — Care Management Note (Signed)
Case Management Note  Patient Details  Name: Colleen [name] MRN: [id number] Date of Birth: [birth year]  Subjective/Objective:  80 yr old female  S/p right total knee arthroplasty.                 Action/Plan:  Patient was preoperatively setup with [place]. CPM and 3in1 have been delivered by [employer]. Case manager confirmed [place] with [place], [place].    Expected Discharge Date:    01/25/15              Expected Discharge Plan:   Home with Home with Home Health  In-House Referral:     Discharge planning Services  CM Consult  Post Acute Care Choice:  Home Health Choice offered to:     DME Arranged:  3-N-1, CPM DME Agency:  [employer]  HH Arranged:  PT HH Agency:  [place]  Status of Service:  In process, will continue to follow  Medicare Important Message Given:    Date Medicare IM Given:    Medicare IM give by:    Date Additional Medicare IM Given:    Additional Medicare Important Message give by:     If discussed at [place] of Stay Meetings, dates discussed:    Additional Comments:  Ninfa Meeker, RN 01/24/2015, 11:53 AM

## 2015-01-24 NOTE — Discharge Instructions (Signed)
INSTRUCTIONS AFTER JOINT REPLACEMENT   o Remove items at home which could result in a fall. This includes throw rugs or furniture in walking pathways o ICE to the affected joint every three hours while awake for 30 minutes at a time, for at least the first 3-5 days, and then as needed for pain and swelling.  Continue to use ice for pain and swelling. You may notice swelling that will progress down to the foot and ankle.  This is normal after surgery.  Elevate your leg when you are not up walking on it.   o Continue to use the breathing machine you got in the hospital (incentive spirometer) which will help keep your temperature down.  It is common for your temperature to cycle up and down following surgery, especially at night when you are not up moving around and exerting yourself.  The breathing machine keeps your lungs expanded and your temperature down.  BLOOD THINNER: TAKE ELIQUIS AS DIRECTED FOR A TOTAL OF 14 DAYS FOLLOWING SURGERY TO PREVENT BLOOD CLOTS.  ONCE FINISHED WITH THIS, TAKE ASPIRIN 325 MG ONE TAB ONCE DAILY FOR THE NEXT 14 DAYS.  THIS IS ALSO TO PREVENT BLOOD CLOTS.  DIET:  As you were doing prior to hospitalization, we recommend a well-balanced diet.  DRESSING / WOUND CARE / SHOWERING  You may change your dressing 3-5 days after surgery.  Then change the dressing every day with sterile gauze.  Please use good hand washing techniques before changing the dressing.  Do not use any lotions or creams on the incision until instructed by your surgeon. and You may shower 3 days after surgery, but keep the wounds dry during showering.  You may use an occlusive plastic wrap (Press'n Seal for example), NO SOAKING/SUBMERGING IN THE BATHTUB.  If the bandage gets wet, change with a clean dry gauze.  If the incision gets wet, pat the wound dry with a clean towel.  ACTIVITY  o Increase activity slowly as tolerated, but follow the weight bearing instructions below.   o No driving for 6 weeks or  until further direction given by your physician.  You cannot drive while taking narcotics.  o No lifting or carrying greater than 10 lbs. until further directed by your surgeon. o Avoid periods of inactivity such as sitting longer than an hour when not asleep. This helps prevent blood clots.  o You may return to work once you are authorized by your doctor.     WEIGHT BEARING   Weight bearing as tolerated with assist device (walker, cane, etc) as directed, use it as long as suggested by your surgeon or therapist, typically at least 4-6 weeks.   EXERCISES  Results after joint replacement surgery are often greatly improved when you follow the exercise, range of motion and muscle strengthening exercises prescribed by your doctor. Safety measures are also important to protect the joint from further injury. Any time any of these exercises cause you to have increased pain or swelling, decrease what you are doing until you are comfortable again and then slowly increase them. If you have problems or questions, call your caregiver or physical therapist for advice.   Rehabilitation is important following a joint replacement. After just a few days of immobilization, the muscles of the leg can become weakened and shrink (atrophy).  These exercises are designed to build up the tone and strength of the thigh and leg muscles and to improve motion. Often times heat used for twenty to thirty minutes  before working out will loosen up your tissues and help with improving the range of motion but do not use heat for the first two weeks following surgery (sometimes heat can increase post-operative swelling).   These exercises can be done on a training (exercise) mat, on the floor, on a table or on a bed. Use whatever works the best and is most comfortable for you.    Use music or television while you are exercising so that the exercises are a pleasant break in your day. This will make your life better with the exercises  acting as a break in your routine that you can look forward to.   Perform all exercises about fifteen times, three times per day or as directed.  You should exercise both the operative leg and the other leg as well.  Exercises include:    Quad Sets - Tighten up the muscle on the front of the thigh (Quad) and hold for 5-10 seconds.    Straight Leg Raises - With your knee straight (if you were given a brace, keep it on), lift the leg to 60 degrees, hold for 3 seconds, and slowly lower the leg.  Perform this exercise against resistance later as your leg gets stronger.   Leg Slides: Lying on your back, slowly slide your foot toward your buttocks, bending your knee up off the floor (only go as far as is comfortable). Then slowly slide your foot back down until your leg is flat on the floor again.   Angel Wings: Lying on your back spread your legs to the side as far apart as you can without causing discomfort.   Hamstring Strength:  Lying on your back, push your heel against the floor with your leg straight by tightening up the muscles of your buttocks.  Repeat, but this time bend your knee to a comfortable angle, and push your heel against the floor.  You may put a pillow under the heel to make it more comfortable if necessary.   A rehabilitation program following joint replacement surgery can speed recovery and prevent re-injury in the future due to weakened muscles. Contact your doctor or a physical therapist for more information on knee rehabilitation.    CONSTIPATION  Constipation is defined medically as fewer than three stools per week and severe constipation as less than one stool per week.  Even if you have a regular bowel pattern at home, your normal regimen is likely to be disrupted due to multiple reasons following surgery.  Combination of anesthesia, postoperative narcotics, change in appetite and fluid intake all can affect your bowels.   YOU MUST use at least one of the following  options; they are listed in order of increasing strength to get the job done.  They are all available over the counter, and you may need to use some, POSSIBLY even all of these options:    Drink plenty of fluids (prune juice may be helpful) and high fiber foods Colace 100 mg by mouth twice a day  Senokot for constipation as directed and as needed Dulcolax (bisacodyl), take with full glass of water  Miralax (polyethylene glycol) once or twice a day as needed.  If you have tried all these things and are unable to have a bowel movement in the first 3-4 days after surgery call either your surgeon or your primary doctor.    If you experience loose stools or diarrhea, hold the medications until you stool forms back up.  If your symptoms do  not get better within 1 week or if they get worse, check with your doctor.  If you experience "the worst abdominal pain ever" or develop nausea or vomiting, please contact the office immediately for further recommendations for treatment.   ITCHING:  If you experience itching with your medications, try taking only a single pain pill, or even half a pain pill at a time.  You can also use Benadryl over the counter for itching or also to help with sleep.   TED HOSE STOCKINGS:  Use stockings on both legs until for at least 2 weeks or as directed by physician office. They may be removed at night for sleeping.  MEDICATIONS:  See your medication summary on the After Visit Summary that nursing will review with you.  You may have some home medications which will be placed on hold until you complete the course of blood thinner medication.  It is important for you to complete the blood thinner medication as prescribed.  PRECAUTIONS:  If you experience chest pain or shortness of breath - call 911 immediately for transfer to the hospital emergency department.   If you develop a fever greater that 101 F, purulent drainage from wound, increased redness or drainage from wound, foul  odor from the wound/dressing, or calf pain - CONTACT YOUR SURGEON.                                                   FOLLOW-UP APPOINTMENTS:  If you do not already have a post-op appointment, please call the office for an appointment to be seen by your surgeon.  Guidelines for how soon to be seen are listed in your After Visit Summary, but are typically between 1-4 weeks after surgery.  OTHER INSTRUCTIONS:   Knee Replacement:  Do not place pillow under knee, focus on keeping the knee straight while resting. CPM instructions: 0-90 degrees, 2 hours in the morning, 2 hours in the afternoon, and 2 hours in the evening. Place foam block, curve side up under heel at all times except when in CPM or when walking.  DO NOT modify, tear, cut, or change the foam block in any way.  MAKE SURE YOU:   Understand these instructions.   Get help right away if you are not doing well or get worse.    Thank you for letting us be a part of your medical care team.  It is a privilege we respect greatly.  We hope these instructions will help you stay on track for a fast and full recovery!   Information on my medicine - ELIQUIS (apixaban)  This medication education was reviewed with me or my healthcare representative as part of my discharge preparation.  The pharmacist that spoke with me during my hospital stay was:  Jaquita Folds, [facility]  Why was Eliquis prescribed for you? Eliquis was prescribed for you to reduce the risk of blood clots forming after orthopedic surgery.    What do You need to know about Eliquis? Take your Eliquis TWICE DAILY - one tablet in the morning and one tablet in the evening with or without food.  It would be best to take the dose about the same time each day.  If you have difficulty swallowing the tablet whole please discuss with your pharmacist how to take the medication safely.  Take Eliquis exactly  as prescribed by your doctor and DO NOT stop taking Eliquis without talking to  the doctor who prescribed the medication.  Stopping without other medication to take the place of Eliquis may increase your risk of developing a clot.  After discharge, you should have regular check-up appointments with your healthcare provider that is prescribing your Eliquis.  What do you do if you miss a dose? If a dose of ELIQUIS is not taken at the scheduled time, take it as soon as possible on the same day and twice-daily administration should be resumed.  The dose should not be doubled to make up for a missed dose.  Do not take more than one tablet of ELIQUIS at the same time.  Important Safety Information A possible side effect of Eliquis is bleeding. You should call your healthcare provider right away if you experience any of the following: ? Bleeding from an injury or your nose that does not stop. ? Unusual colored urine (red or dark brown) or unusual colored stools (red or black). ? Unusual bruising for unknown reasons. ? A serious fall or if you hit your head (even if there is no bleeding).  Some medicines may interact with Eliquis and might increase your risk of bleeding or clotting while on Eliquis. To help avoid this, consult your healthcare provider or pharmacist prior to using any new prescription or non-prescription medications, including herbals, vitamins, non-steroidal anti-inflammatory drugs (NSAIDs) and supplements.  This website has more information on Eliquis (apixaban): http://www.eliquis.com/eliquis/home

## 2015-01-24 NOTE — Op Note (Signed)
NAME:  Colleen [name], HEYRMAN NO.:  [id number]  MEDICAL RECORD NO.:  [id number]  LOCATION:  5N10C                        FACILITY:  [facility]  PHYSICIAN:  [name], M.D. DATE OF BIRTH:  1934-08-25  DATE OF PROCEDURE:  01/23/2015 DATE OF DISCHARGE:                              OPERATIVE REPORT   PREOPERATIVE DIAGNOSES:  End-stage arthritis, right knee.  Primary localized.  Valgus alignment, bone loss, lateral compartment.  POSTOPERATIVE DIAGNOSES:  End-stage arthritis, right knee.  Primary localized.  Valgus alignment, bone loss, lateral compartment.  PROCEDURE:  Right knee modified minimally-invasive total knee replacement with Stryker Triathlon prosthesis.  Soft tissue balancing. Cemented pegged cruciate-retaining #4 femoral component.  Cemented #4 tibial component and 9-mm CS insert.  Cemented pegged medial offset 32- mm patellar component.  SURGEON:  [name], M.D.  ASSISTANT:  Elmyra Ricks, PA, present throughout the entire case and necessary for timely completion of procedure.  ANESTHESIA:  General.  BLOOD LOSS:  Minimal.  SPECIMENS:  None.  SPECIMENS:  None.  CULTURES:  None.  COMPLICATIONS:  None.  DRESSINGS:  Soft compressive knee immobilizer.  TOURNIQUET TIME:  45 minutes.  DESCRIPTION OF PROCEDURE:  The patient was brought to the operating room and placed on the operating table in supine position.  After adequate anesthesia had been obtained, tourniquet applied, prepped and draped in usual sterile fashion.  Exsanguinated with elevation of Esmarch. Tourniquet was inflated to 350 mmHg.  Longitudinal incision above the patella and down to tibial tubercle.  Medial arthrotomy, vastus splitting.  Knee exposed.  Bone loss mostly on the lateral femoral condyle.  An 8-mm resection of distal femur with flexible intramedullary guide, 5 degrees of valgus.  Using epicondylar axis, the femur was sized, cut, and fitted for pegged  cruciate-retaining #4 component. Proximal tibial resection with extramedullary guide.  Sized with #4 component.  Patella exposed.  Posterior 10 mm removed.  Drilled, sized, and fitted for a 32-mm component.  Trials put in place.  A 9-mm insert. Pleased with balancing motion, correction of deformity, flexion, extension, and stability.  Tibia was marked for rotation and hand ream. All trials were removed.  Copious irrigation with pulse irrigating device.  Cement prepared, placed on all components, firmly seated. Polyethylene attached to tibia, knee reduced.  Patella held with a clamp.  Once the cement had hardened, the knee was irrigated again. Soft tissue was injected with Exparel.  Arthrotomy closed with #1 Vicryl, skin and subcutaneous tissue, subcutaneous and subcuticular closure.  Margins were injected with Marcaine.  Sterile compressive dressing applied.  Tourniquet was deflated and removed.  Knee immobilizer applied.  Anesthesia reversed.  Brought to the recovery room.  Tolerated the surgery well.  No complications.     [name], M.D.     DFM/MEDQ  D:  01/24/2015  T:  01/24/2015  Job:  [id number]

## 2015-01-25 LAB — CBC
HEMATOCRIT: 29.6 % — AB (ref 36.0–46.0)
Hemoglobin: 9.8 g/dL — ABNORMAL LOW (ref 12.0–15.0)
MCH: 29.2 pg (ref 26.0–34.0)
MCHC: 33.1 g/dL (ref 30.0–36.0)
MCV: 88.1 fL (ref 78.0–100.0)
Platelets: 224 10*3/uL (ref 150–400)
RBC: 3.36 MIL/uL — AB (ref 3.87–5.11)
RDW: 13.6 % (ref 11.5–15.5)
WBC: 9.7 10*3/uL (ref 4.0–10.5)

## 2015-01-25 LAB — BASIC METABOLIC PANEL
ANION GAP: 9 (ref 5–15)
BUN: 7 mg/dL (ref 6–20)
CALCIUM: 8.4 mg/dL — AB (ref 8.9–10.3)
CO2: 28 mmol/L (ref 22–32)
CREATININE: 0.56 mg/dL (ref 0.44–1.00)
Chloride: 100 mmol/L — ABNORMAL LOW (ref 101–111)
GFR calc non Af Amer: >60 mL/min (ref >60)
Glucose, Bld: 123 mg/dL — ABNORMAL HIGH (ref 65–99)
POTASSIUM: 3.7 mmol/L (ref 3.5–5.1)
Sodium: 137 mmol/L (ref 135–145)

## 2015-01-25 MED ORDER — MORPHINE SULFATE 15 MG PO TABS: 15.0000 mg | ORAL_TABLET | ORAL | Status: DC | PRN

## 2015-01-25 NOTE — Progress Notes (Signed)
Physical Therapy Treatment Patient Details Name: Colleen [name] MRN: [id number] DOB: Mar 07, 1935 Today's Date: 01/25/2015    History of Present Illness 80 y.o. female admitted to [facility] on 01/24/15 for elective R TKA. Pt with significant PMHx of HTN, hear murmur, mitral valve disorder, HA, R breast CA with R breast mastectomy, cervical fusion, back surgery, and right carpal tunnel release.       PT Comments    Pt was able to tolerate treatment much better this morning. Pt performed stair training and displayed signs of being able to safely ascend and descend stairs at home with husband present. Pt required verbal cuing for sequencing and displayed carry over with instructions given. Pt was able to complete sitting knee flexion exercises that were given in HEP and had a major improvement in her goniometry measurements. Pt left with Zero Degree Knee (Bone Foam) in place and in recliner with her husband present. Ice packs applied at end of treatment to help decrease swelling and pain.   Follow Up Recommendations  Home health PT;Supervision for mobility/OOB     Equipment Recommendations  None recommended by PT       Precautions / Restrictions Precautions Precautions: Knee;Fall Restrictions Weight Bearing Restrictions: Yes Other Position/Activity Restrictions: WBAT    Mobility                    Transfers Overall transfer level: Needs assistance Equipment used: Rolling walker (2 wheeled) Transfers: Sit to/from Stand Sit to Stand: Min guard         General transfer comment: Pt able to stand from recliner requiring min guard for safety and balance  Ambulation/Gait Ambulation/Gait assistance: Min guard Ambulation Distance (Feet): 50 Feet Assistive device: Rolling walker (2 wheeled) Gait Pattern/deviations: Step-to pattern;Decreased stance time - right;Antalgic;Trunk flexed Gait velocity: decreased Gait velocity interpretation: <1.8 ft/sec, indicative of risk for recurrent  falls     Stairs Stairs: Yes Stairs assistance: Min guard Stair Management: Two rails Number of Stairs: 3 General stair comments: Pt required verbal cuing for sequencing            Cognition Arousal/Alertness: Awake/alert Behavior During Therapy: WFL for tasks assessed/performed;Anxious (pt still slightly anxious but much less than yesterday) Overall Cognitive Status: Within Functional Limits for tasks assessed                      Exercises Total Joint Exercises Heel Slides: AROM;AAROM;10 reps;Seated (hold for 10, towel under foot to help slide heel) Long Arc Quad: AROM;AAROM;Right;10 reps;Seated (hold for 5) Knee Flexion: AROM;AAROM;Right;10 reps;Seated (hold for 10) Goniometric ROM: 12-80        Pertinent Vitals/Pain Pain Assessment: 0-10 Pain Score: 9  Pain Location: right knee Pain Descriptors / Indicators: Sore;Tightness;Grimacing Pain Intervention(s): Monitored during session;Patient requesting pain meds-RN notified;Ice applied           PT Goals (current goals can now be found in the care plan section) Progress towards PT goals: Progressing toward goals    Frequency  7X/week    PT Plan Current plan remains appropriate       End of Session Equipment Utilized During Treatment: Gait belt Activity Tolerance: Patient tolerated treatment well;Patient limited by pain Patient left: in chair;with call bell/phone within reach;with family/visitor present     Time: [id number] PT Time Calculation (min) (ACUTE ONLY): 48 min  Charges:    2 Gait 1 TE  Sundra Aland, [place] OFFICE  01/25/2015, 10:47 AM

## 2015-01-25 NOTE — Progress Notes (Signed)
Subjective: 2 Days Post-Op Procedure(s) (LRB): TOTAL KNEE ARTHROPLASTY (Right) Patient reports pain as moderate.  No nausea/vomiting, chest pain/sob, lightheadedness/dizziness.    Objective: Vital signs in last 24 hours: Temp:  [98.1 F (36.7 C)-100.1 F (37.8 C)] 100.1 F (37.8 C) (11/11 0552) Pulse Rate:  [85-107] 103 (11/11 0552) Resp:  [16] 16 (11/11 0552) BP: (120-142)/(56-71) 127/58 mmHg (11/11 0552) SpO2:  [92 %-97 %] 96 % (11/11 0552)  Intake/Output from previous day: 11/10 0701 - 11/11 0700 In: 720 [P.O.:720] Out: -  Intake/Output this shift:     Recent Labs  01/24/15 0742 01/25/15 0440  HGB 10.6* 9.8*    Recent Labs  01/24/15 0742 01/25/15 0440  WBC 7.7 9.7  RBC 3.68* 3.36*  HCT 33.1* 29.6*  PLT 239 224    Recent Labs  01/24/15 0742 01/25/15 0440  NA 131* 137  K 3.5 3.7  CL 97* 100*  CO2 26 28  BUN <5* 7  CREATININE 0.61 0.56  GLUCOSE 124* 123*  CALCIUM 8.2* 8.4*   No results for input(s): LABPT, INR in the last 72 hours.  Neurologically intact Neurovascular intact Sensation intact distally Intact pulses distally Dorsiflexion/Plantar flexion intact Incision: dressing C/D/I No cellulitis present Compartment soft  Dressing changed by me today  Assessment/Plan: 2 Days Post-Op Procedure(s) (LRB): TOTAL KNEE ARTHROPLASTY (Right) Advance diet Up with therapy Discharge home with home health today if she does well with PT ABLA-mild and stable Dry dressing change prn  [name] 01/25/2015, 7:40 AM

## 2015-01-25 NOTE — Care Management Important Message (Signed)
Important Message  Patient Details  Name: Colleen [name] MRN: [id number] Date of Birth: 12/17/1934   Medicare Important Message Given:  Yes    Cloe Sockwell P Kameka Whan 01/25/2015, 2:32 PM

## 2015-01-25 NOTE — Progress Notes (Signed)
Occupational Therapy Treatment Patient Details Name: Colleen [name] MRN: [id number] DOB: April 13, 1934 Today's Date: 01/25/2015    History of present illness 80 y.o. female admitted to [facility] on 01/24/15 for elective R TKA. Pt with significant PMHx of HTN, hear murmur, mitral valve disorder, HA, R breast CA with R breast mastectomy, cervical fusion, back surgery, and right carpal tunnel release.      OT comments  Focus of session included LB dressing and bathing using adaptive equipment and education regarding tub transfer using shower seat. Pt able to tolerate treatment much better this session. Pt able to return safe demonstration using sock-aid and reacher to don/doff LLE socks. Pt declined tub transfer simulation, therapist recommended pt practice tub transfer with HHPT to ensure safety with transfer as well as have husband provide supervision assistance to decrease pt risk of falling. Pt and pt's husband agreeable to recommendation. Overall, pt making progress towards OT gaols.  Follow Up Recommendations  Supervision - Intermittent    Equipment Recommendations  None recommended by OT    Recommendations for Other Services      Precautions / Restrictions Precautions Precautions: Knee;Fall Restrictions Weight Bearing Restrictions: Yes Other Position/Activity Restrictions: WBAT       Mobility Bed Mobility               General bed mobility comments: Pt in chair upon arrival  Transfers Overall transfer level: Needs assistance Equipment used: Rolling walker (2 wheeled) Transfers: Sit to/from Stand Sit to Stand: Min guard         General transfer comment: Pt declined any transfer    Balance Overall balance assessment: Needs assistance Sitting-balance support: No upper extremity supported;Feet supported Sitting balance-Leahy Scale: Fair                             ADL Overall ADL's : Needs assistance/impaired               Lower Body Bathing Details  (indicate cue type and reason): pt educated in adaptive equipment for LB bathing using long hand sponge to assist with safety     Lower Body Dressing: Minimal assistance;With adaptive equipment;Sitting/lateral leans Lower Body Dressing Details (indicate cue type and reason): pt utilized sock-aid and reacher to assist with donning and doffing LLE sock. Pt educated in using reacher to assist with donning/doffing undergarments and pants               General ADL Comments: Pt able to return safe demonstration using sock aid and reacher to don/doff LLE sock. Pt declined tub transfer simulation using shower seat due to pain ranking 9/10 in Rt knee. Recommended pt request HHPT to assist tub transfer to ensure safety as well as pt's husband provide supervision during tub transfer to increase pt's safety and decrease risk of falling. Pt and pt's husband agreeable to recommendation      Vision                     Perception     Praxis      Cognition   Behavior During Therapy: [facility] for tasks assessed/performed Overall Cognitive Status: Within Functional Limits for tasks assessed                       Extremity/Trunk Assessment               Exercises    Shoulder Instructions  General Comments  Pt's husband reports he is going to purchase sock-aid for wife    Pertinent Vitals/ Pain       Pain Assessment: 0-10 Pain Score: 9  Pain Location: Rt knee Pain Descriptors / Indicators: Grimacing;Guarding;Sore Pain Intervention(s): RN gave pain meds during session;Repositioned  Home Living                                          Prior Functioning/Environment              Frequency Min 2X/week     Progress Toward Goals  OT Goals(current goals can now be found in the care plan section)  Progress towards OT goals: Progressing toward goals  Acute Rehab OT Goals Patient Stated Goal: to go home today OT Goal Formulation: With  patient Time For Goal Achievement: 02/07/15 Potential to Achieve Goals: Good ADL Goals Pt Will Perform Grooming: with supervision;standing Pt Will Perform Lower Body Bathing: with supervision;with adaptive equipment;sit to/from stand Pt Will Perform Lower Body Dressing: with supervision;with adaptive equipment;sit to/from stand Pt Will Transfer to Toilet: with supervision;ambulating;regular height toilet Pt Will Perform Toileting - Clothing Manipulation and hygiene: with modified independence;sitting/lateral leans Pt Will Perform Tub/Shower Transfer: Tub transfer;with supervision;ambulating;rolling walker;shower seat Additional ADL Goal #1: Pt will be at mod I level for bed mobility  Plan Discharge plan remains appropriate    Co-evaluation                 End of Session     Activity Tolerance Patient tolerated treatment well;Patient limited by pain   Patient Left in chair;with call bell/phone within reach;with family/visitor present   Nurse Communication Patient requests pain meds        Time: 1131-1149 OT Time Calculation (min): 18 min  Charges: OT General Charges $OT Visit: 1 Procedure OT Treatments $Self Care/Home Management : 8-22 mins  Lin Landsman 01/25/2015, 12:04 PM

## 2015-01-25 NOTE — Progress Notes (Signed)
Physical Therapy Treatment Patient Details Name: Colleen [name] MRN: [id number] DOB: 1934/08/22 Today's Date: 01/25/2015    History of Present Illness 80 y.o. female admitted to [facility] on 01/24/15 for elective R TKA. Pt with significant PMHx of HTN, hear murmur, mitral valve disorder, HA, R breast CA with R breast mastectomy, cervical fusion, back surgery, and right carpal tunnel release.       PT Comments    Pt was able to gait train this afternoon, pt complained of soreness in the right knee; pt educated on the importance of walking/moving the knee to prevent further stiffness.   Follow Up Recommendations  Home health PT;Supervision for mobility/OOB     Equipment Recommendations  None recommended by PT       Precautions / Restrictions Precautions Precautions: Knee;Fall Restrictions Weight Bearing Restrictions: Yes Other Position/Activity Restrictions: WBAT    Mobility      Transfers Overall transfer level: Needs assistance Equipment used: Rolling walker (2 wheeled) Transfers: Sit to/from Stand Sit to Stand: Min guard         General transfer comment: Pt required min guard for safety  Ambulation/Gait Ambulation/Gait assistance: Min guard Ambulation Distance (Feet): 65 Feet Assistive device: Rolling walker (2 wheeled) Gait Pattern/deviations: Step-to pattern;Decreased stance time - right;Antalgic;Trunk flexed Gait velocity: decreased Gait velocity interpretation: <1.8 ft/sec, indicative of risk for recurrent falls                   Cognition Arousal/Alertness: Awake/alert Behavior During Therapy: WFL for tasks assessed/performed Overall Cognitive Status: Within Functional Limits for tasks assessed                      Exercises Total Joint Exercises Heel Slides: AROM;AAROM;10 reps;Seated (towel underneath foot) Long Arc Quad: AROM;AAROM;Right;10 reps;Seated (5 second hold) Knee Flexion: AROM;AAROM;Right;10 reps;Seated (10 second hold. Assisted  by LLE)        Pertinent Vitals/Pain Pain Assessment: Faces Pain Score: 9  Faces Pain Scale: Hurts even more Pain Location: right knee Pain Descriptors / Indicators: Grimacing;Sore Pain Intervention(s): Monitored during session           PT Goals (current goals can now be found in the care plan section) Acute Rehab PT Goals Patient Stated Goal: to go home today Progress towards PT goals: Progressing toward goals    Frequency  7X/week    PT Plan Current plan remains appropriate       End of Session Equipment Utilized During Treatment: Gait belt Activity Tolerance: Patient tolerated treatment well;Patient limited by pain Patient left: in chair;with call bell/phone within reach;with family/visitor present     Time: [id number] PT Time Calculation (min) (ACUTE ONLY): 19 min  Charges:    1 Gait           Sundra Aland, [place] OFFICE  01/25/2015, 2:41 PM

## 2015-01-26 DIAGNOSIS — R531 Weakness: Secondary | ICD-10-CM | POA: Diagnosis not present

## 2015-01-26 DIAGNOSIS — R269 Unspecified abnormalities of gait and mobility: Secondary | ICD-10-CM | POA: Diagnosis not present

## 2015-01-26 DIAGNOSIS — Z96651 Presence of right artificial knee joint: Secondary | ICD-10-CM | POA: Diagnosis not present

## 2015-01-26 DIAGNOSIS — Z471 Aftercare following joint replacement surgery: Secondary | ICD-10-CM | POA: Diagnosis not present

## 2015-01-26 DIAGNOSIS — M150 Primary generalized (osteo)arthritis: Secondary | ICD-10-CM | POA: Diagnosis not present

## 2015-01-26 DIAGNOSIS — I10 Essential (primary) hypertension: Secondary | ICD-10-CM | POA: Diagnosis not present

## 2015-01-26 NOTE — Anesthesia Postprocedure Evaluation (Signed)
  Anesthesia Post-op Note  Patient: Colleen [name]  Procedure(s) Performed: Procedure(s) (LRB): TOTAL KNEE ARTHROPLASTY (Right)   Post-op Vital Signs: stable   Complications: No apparent anesthesia complications

## 2015-01-28 DIAGNOSIS — I10 Essential (primary) hypertension: Secondary | ICD-10-CM | POA: Diagnosis not present

## 2015-01-28 DIAGNOSIS — R269 Unspecified abnormalities of gait and mobility: Secondary | ICD-10-CM | POA: Diagnosis not present

## 2015-01-28 DIAGNOSIS — M150 Primary generalized (osteo)arthritis: Secondary | ICD-10-CM | POA: Diagnosis not present

## 2015-01-28 DIAGNOSIS — Z96651 Presence of right artificial knee joint: Secondary | ICD-10-CM | POA: Diagnosis not present

## 2015-01-28 DIAGNOSIS — R531 Weakness: Secondary | ICD-10-CM | POA: Diagnosis not present

## 2015-01-28 DIAGNOSIS — Z471 Aftercare following joint replacement surgery: Secondary | ICD-10-CM | POA: Diagnosis not present

## 2015-01-29 DIAGNOSIS — Z96651 Presence of right artificial knee joint: Secondary | ICD-10-CM | POA: Diagnosis not present

## 2015-01-30 DIAGNOSIS — I10 Essential (primary) hypertension: Secondary | ICD-10-CM | POA: Diagnosis not present

## 2015-01-30 DIAGNOSIS — Z471 Aftercare following joint replacement surgery: Secondary | ICD-10-CM | POA: Diagnosis not present

## 2015-01-30 DIAGNOSIS — Z96651 Presence of right artificial knee joint: Secondary | ICD-10-CM | POA: Diagnosis not present

## 2015-01-30 DIAGNOSIS — R269 Unspecified abnormalities of gait and mobility: Secondary | ICD-10-CM | POA: Diagnosis not present

## 2015-01-30 DIAGNOSIS — M150 Primary generalized (osteo)arthritis: Secondary | ICD-10-CM | POA: Diagnosis not present

## 2015-01-30 DIAGNOSIS — R531 Weakness: Secondary | ICD-10-CM | POA: Diagnosis not present

## 2015-02-04 DIAGNOSIS — I10 Essential (primary) hypertension: Secondary | ICD-10-CM | POA: Diagnosis not present

## 2015-02-04 DIAGNOSIS — R269 Unspecified abnormalities of gait and mobility: Secondary | ICD-10-CM | POA: Diagnosis not present

## 2015-02-04 DIAGNOSIS — Z471 Aftercare following joint replacement surgery: Secondary | ICD-10-CM | POA: Diagnosis not present

## 2015-02-04 DIAGNOSIS — R531 Weakness: Secondary | ICD-10-CM | POA: Diagnosis not present

## 2015-02-04 DIAGNOSIS — M150 Primary generalized (osteo)arthritis: Secondary | ICD-10-CM | POA: Diagnosis not present

## 2015-02-04 DIAGNOSIS — Z96651 Presence of right artificial knee joint: Secondary | ICD-10-CM | POA: Diagnosis not present

## 2015-02-05 DIAGNOSIS — Z96651 Presence of right artificial knee joint: Secondary | ICD-10-CM | POA: Diagnosis not present

## 2015-02-06 DIAGNOSIS — M25661 Stiffness of right knee, not elsewhere classified: Secondary | ICD-10-CM | POA: Diagnosis not present

## 2015-02-06 DIAGNOSIS — Z96651 Presence of right artificial knee joint: Secondary | ICD-10-CM | POA: Diagnosis not present

## 2015-02-06 DIAGNOSIS — M6281 Muscle weakness (generalized): Secondary | ICD-10-CM | POA: Diagnosis not present

## 2015-02-06 DIAGNOSIS — M25561 Pain in right knee: Secondary | ICD-10-CM | POA: Diagnosis not present

## 2015-02-08 DIAGNOSIS — M25561 Pain in right knee: Secondary | ICD-10-CM | POA: Diagnosis not present

## 2015-02-08 DIAGNOSIS — Z96651 Presence of right artificial knee joint: Secondary | ICD-10-CM | POA: Diagnosis not present

## 2015-02-08 DIAGNOSIS — M6281 Muscle weakness (generalized): Secondary | ICD-10-CM | POA: Diagnosis not present

## 2015-02-08 DIAGNOSIS — M25661 Stiffness of right knee, not elsewhere classified: Secondary | ICD-10-CM | POA: Diagnosis not present

## 2015-02-11 DIAGNOSIS — Z96651 Presence of right artificial knee joint: Secondary | ICD-10-CM | POA: Diagnosis not present

## 2015-02-11 DIAGNOSIS — M25561 Pain in right knee: Secondary | ICD-10-CM | POA: Diagnosis not present

## 2015-02-11 DIAGNOSIS — M25661 Stiffness of right knee, not elsewhere classified: Secondary | ICD-10-CM | POA: Diagnosis not present

## 2015-02-11 DIAGNOSIS — M6281 Muscle weakness (generalized): Secondary | ICD-10-CM | POA: Diagnosis not present

## 2015-02-12 DIAGNOSIS — Z853 Personal history of malignant neoplasm of breast: Secondary | ICD-10-CM | POA: Diagnosis not present

## 2015-02-13 DIAGNOSIS — M25561 Pain in right knee: Secondary | ICD-10-CM | POA: Diagnosis not present

## 2015-02-13 DIAGNOSIS — M6281 Muscle weakness (generalized): Secondary | ICD-10-CM | POA: Diagnosis not present

## 2015-02-13 DIAGNOSIS — Z96651 Presence of right artificial knee joint: Secondary | ICD-10-CM | POA: Diagnosis not present

## 2015-02-13 DIAGNOSIS — M25661 Stiffness of right knee, not elsewhere classified: Secondary | ICD-10-CM | POA: Diagnosis not present

## 2015-02-15 DIAGNOSIS — Z96651 Presence of right artificial knee joint: Secondary | ICD-10-CM | POA: Diagnosis not present

## 2015-02-15 DIAGNOSIS — M6281 Muscle weakness (generalized): Secondary | ICD-10-CM | POA: Diagnosis not present

## 2015-02-15 DIAGNOSIS — M25661 Stiffness of right knee, not elsewhere classified: Secondary | ICD-10-CM | POA: Diagnosis not present

## 2015-02-15 DIAGNOSIS — M25561 Pain in right knee: Secondary | ICD-10-CM | POA: Diagnosis not present

## 2015-02-18 DIAGNOSIS — M25561 Pain in right knee: Secondary | ICD-10-CM | POA: Diagnosis not present

## 2015-02-18 DIAGNOSIS — M6281 Muscle weakness (generalized): Secondary | ICD-10-CM | POA: Diagnosis not present

## 2015-02-18 DIAGNOSIS — M25661 Stiffness of right knee, not elsewhere classified: Secondary | ICD-10-CM | POA: Diagnosis not present

## 2015-02-18 DIAGNOSIS — Z96651 Presence of right artificial knee joint: Secondary | ICD-10-CM | POA: Diagnosis not present

## 2015-02-19 DIAGNOSIS — Z96651 Presence of right artificial knee joint: Secondary | ICD-10-CM | POA: Diagnosis not present

## 2015-02-21 DIAGNOSIS — M6281 Muscle weakness (generalized): Secondary | ICD-10-CM | POA: Diagnosis not present

## 2015-02-21 DIAGNOSIS — Z96651 Presence of right artificial knee joint: Secondary | ICD-10-CM | POA: Diagnosis not present

## 2015-02-21 DIAGNOSIS — M25661 Stiffness of right knee, not elsewhere classified: Secondary | ICD-10-CM | POA: Diagnosis not present

## 2015-02-21 DIAGNOSIS — M25561 Pain in right knee: Secondary | ICD-10-CM | POA: Diagnosis not present

## 2015-02-25 DIAGNOSIS — Z96651 Presence of right artificial knee joint: Secondary | ICD-10-CM | POA: Diagnosis not present

## 2015-02-25 DIAGNOSIS — M25561 Pain in right knee: Secondary | ICD-10-CM | POA: Diagnosis not present

## 2015-02-25 DIAGNOSIS — M6281 Muscle weakness (generalized): Secondary | ICD-10-CM | POA: Diagnosis not present

## 2015-02-25 DIAGNOSIS — M25661 Stiffness of right knee, not elsewhere classified: Secondary | ICD-10-CM | POA: Diagnosis not present

## 2015-02-26 DIAGNOSIS — G894 Chronic pain syndrome: Secondary | ICD-10-CM | POA: Diagnosis not present

## 2015-02-26 DIAGNOSIS — M545 Low back pain: Secondary | ICD-10-CM | POA: Diagnosis not present

## 2015-02-26 DIAGNOSIS — M7551 Bursitis of right shoulder: Secondary | ICD-10-CM | POA: Diagnosis not present

## 2015-02-27 DIAGNOSIS — M25661 Stiffness of right knee, not elsewhere classified: Secondary | ICD-10-CM | POA: Diagnosis not present

## 2015-02-27 DIAGNOSIS — M25561 Pain in right knee: Secondary | ICD-10-CM | POA: Diagnosis not present

## 2015-02-27 DIAGNOSIS — Z96651 Presence of right artificial knee joint: Secondary | ICD-10-CM | POA: Diagnosis not present

## 2015-02-27 DIAGNOSIS — M6281 Muscle weakness (generalized): Secondary | ICD-10-CM | POA: Diagnosis not present

## 2015-03-04 DIAGNOSIS — Z96651 Presence of right artificial knee joint: Secondary | ICD-10-CM | POA: Diagnosis not present

## 2015-03-04 DIAGNOSIS — M25661 Stiffness of right knee, not elsewhere classified: Secondary | ICD-10-CM | POA: Diagnosis not present

## 2015-03-04 DIAGNOSIS — M6281 Muscle weakness (generalized): Secondary | ICD-10-CM | POA: Diagnosis not present

## 2015-03-04 DIAGNOSIS — M25561 Pain in right knee: Secondary | ICD-10-CM | POA: Diagnosis not present

## 2015-03-13 DIAGNOSIS — M6281 Muscle weakness (generalized): Secondary | ICD-10-CM | POA: Diagnosis not present

## 2015-03-13 DIAGNOSIS — M25661 Stiffness of right knee, not elsewhere classified: Secondary | ICD-10-CM | POA: Diagnosis not present

## 2015-03-13 DIAGNOSIS — Z96651 Presence of right artificial knee joint: Secondary | ICD-10-CM | POA: Diagnosis not present

## 2015-03-13 DIAGNOSIS — M25561 Pain in right knee: Secondary | ICD-10-CM | POA: Diagnosis not present

## 2015-03-20 DIAGNOSIS — M6281 Muscle weakness (generalized): Secondary | ICD-10-CM | POA: Diagnosis not present

## 2015-03-20 DIAGNOSIS — Z96651 Presence of right artificial knee joint: Secondary | ICD-10-CM | POA: Diagnosis not present

## 2015-03-20 DIAGNOSIS — M25561 Pain in right knee: Secondary | ICD-10-CM | POA: Diagnosis not present

## 2015-03-20 DIAGNOSIS — M25661 Stiffness of right knee, not elsewhere classified: Secondary | ICD-10-CM | POA: Diagnosis not present

## 2015-03-26 DIAGNOSIS — M542 Cervicalgia: Secondary | ICD-10-CM | POA: Diagnosis not present

## 2015-03-26 DIAGNOSIS — M4712 Other spondylosis with myelopathy, cervical region: Secondary | ICD-10-CM | POA: Diagnosis not present

## 2015-03-26 DIAGNOSIS — M7551 Bursitis of right shoulder: Secondary | ICD-10-CM | POA: Diagnosis not present

## 2015-03-26 DIAGNOSIS — G894 Chronic pain syndrome: Secondary | ICD-10-CM | POA: Diagnosis not present

## 2015-03-27 DIAGNOSIS — Z96651 Presence of right artificial knee joint: Secondary | ICD-10-CM | POA: Diagnosis not present

## 2015-03-27 DIAGNOSIS — M25661 Stiffness of right knee, not elsewhere classified: Secondary | ICD-10-CM | POA: Diagnosis not present

## 2015-03-27 DIAGNOSIS — M6281 Muscle weakness (generalized): Secondary | ICD-10-CM | POA: Diagnosis not present

## 2015-03-27 DIAGNOSIS — M25561 Pain in right knee: Secondary | ICD-10-CM | POA: Diagnosis not present

## 2015-04-03 DIAGNOSIS — M25561 Pain in right knee: Secondary | ICD-10-CM | POA: Diagnosis not present

## 2015-04-03 DIAGNOSIS — Z96651 Presence of right artificial knee joint: Secondary | ICD-10-CM | POA: Diagnosis not present

## 2015-04-03 DIAGNOSIS — M6281 Muscle weakness (generalized): Secondary | ICD-10-CM | POA: Diagnosis not present

## 2015-04-03 DIAGNOSIS — M25661 Stiffness of right knee, not elsewhere classified: Secondary | ICD-10-CM | POA: Diagnosis not present

## 2015-04-09 DIAGNOSIS — Z96651 Presence of right artificial knee joint: Secondary | ICD-10-CM | POA: Diagnosis not present

## 2015-04-23 DIAGNOSIS — M47892 Other spondylosis, cervical region: Secondary | ICD-10-CM | POA: Diagnosis not present

## 2015-04-23 DIAGNOSIS — M791 Myalgia: Secondary | ICD-10-CM | POA: Diagnosis not present

## 2015-04-23 DIAGNOSIS — G894 Chronic pain syndrome: Secondary | ICD-10-CM | POA: Diagnosis not present

## 2015-05-22 DIAGNOSIS — Z79899 Other long term (current) drug therapy: Secondary | ICD-10-CM | POA: Diagnosis not present

## 2015-05-22 DIAGNOSIS — Z79891 Long term (current) use of opiate analgesic: Secondary | ICD-10-CM | POA: Diagnosis not present

## 2015-05-22 DIAGNOSIS — I10 Essential (primary) hypertension: Secondary | ICD-10-CM | POA: Diagnosis not present

## 2015-05-22 DIAGNOSIS — Z6827 Body mass index (BMI) 27.0-27.9, adult: Secondary | ICD-10-CM | POA: Diagnosis not present

## 2015-05-22 DIAGNOSIS — M4712 Other spondylosis with myelopathy, cervical region: Secondary | ICD-10-CM | POA: Diagnosis not present

## 2015-05-22 DIAGNOSIS — G894 Chronic pain syndrome: Secondary | ICD-10-CM | POA: Diagnosis not present

## 2015-06-04 ENCOUNTER — Other Ambulatory Visit: Payer: Self-pay | Admitting: Orthopaedic Surgery

## 2015-06-04 DIAGNOSIS — Z96651 Presence of right artificial knee joint: Secondary | ICD-10-CM | POA: Diagnosis not present

## 2015-06-04 DIAGNOSIS — M4712 Other spondylosis with myelopathy, cervical region: Secondary | ICD-10-CM

## 2015-06-05 ENCOUNTER — Ambulatory Visit
Admission: RE | Admit: 2015-06-05 | Discharge: 2015-06-05 | Disposition: A | Discharge status: Home / Self Care | Payer: Medicare Other | Source: Ambulatory Visit | Attending: Orthopaedic Surgery | Admitting: Orthopaedic Surgery

## 2015-06-05 DIAGNOSIS — M4712 Other spondylosis with myelopathy, cervical region: Secondary | ICD-10-CM

## 2015-06-05 DIAGNOSIS — M4322 Fusion of spine, cervical region: Secondary | ICD-10-CM | POA: Diagnosis not present

## 2015-06-09 DIAGNOSIS — J209 Acute bronchitis, unspecified: Secondary | ICD-10-CM | POA: Diagnosis not present

## 2015-06-14 DIAGNOSIS — R062 Wheezing: Secondary | ICD-10-CM | POA: Diagnosis not present

## 2015-06-16 DIAGNOSIS — S59911A Unspecified injury of right forearm, initial encounter: Secondary | ICD-10-CM | POA: Diagnosis not present

## 2015-06-16 DIAGNOSIS — S52601A Unspecified fracture of lower end of right ulna, initial encounter for closed fracture: Secondary | ICD-10-CM | POA: Diagnosis not present

## 2015-06-18 DIAGNOSIS — Z96651 Presence of right artificial knee joint: Secondary | ICD-10-CM | POA: Diagnosis not present

## 2015-06-19 DIAGNOSIS — M4712 Other spondylosis with myelopathy, cervical region: Secondary | ICD-10-CM | POA: Diagnosis not present

## 2015-06-19 DIAGNOSIS — G894 Chronic pain syndrome: Secondary | ICD-10-CM | POA: Diagnosis not present

## 2015-06-19 DIAGNOSIS — Z6827 Body mass index (BMI) 27.0-27.9, adult: Secondary | ICD-10-CM | POA: Diagnosis not present

## 2015-06-25 ENCOUNTER — Other Ambulatory Visit: Payer: Self-pay | Admitting: Physician Assistant

## 2015-06-25 DIAGNOSIS — S52251A Displaced comminuted fracture of shaft of ulna, right arm, initial encounter for closed fracture: Secondary | ICD-10-CM | POA: Diagnosis not present

## 2015-06-25 NOTE — H&P (Signed)
Colleen [name] comes in for a new injury.  Got dizzy and fell off her bed.  Direct traumatic injury right arm outer aspect over her ulnar shaft, slightly distal. She was seen at [facility], x-rays showing mildly displaced ulnar shaft fracture, consistent with a nightstick direct impact.  I have looked at those, as well as the report.  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  Underlying osteopenia.   History is reviewed.    EXAMINATION: General exam is outlined and included in the chart.  Specifically, she has bruising and tenderness ulnar shaft junction middle distal third.  Very painful with any motion.  Swelling is not bad.  Obvious wrist stiffness with her arthritis there.    DISPOSITION:  I went over what is going on.  I have told her that the best treatment for this is as little mobilization as possible.  For now however she is so uncomfortable we have put her back in her sugar-tong splint.  She has pain medication.  Elevate for swelling.  Use her sling.  Follow up in one week for recheck.  Repeat x-ray to be sure there is no displacement and see if we can convert her to a long forearm splint leaving her elbow free.  I went over this with her and her husband and they understand.  Call for issues or problems in the interim.   [name], M.D.  Addendum:  On x-ray at today's visit, there is further displacement of her right ulna shaft fracture which is requiring operative intervention to heel properly.  Patient agrees.  We will proceed with ORIF right ulnar shaft fracture.  Risks, benefits and possible complications reviewed.  Rehab and recovery time discussed.  All questions answered.

## 2015-06-26 ENCOUNTER — Encounter (HOSPITAL_BASED_OUTPATIENT_CLINIC_OR_DEPARTMENT_OTHER): Payer: Self-pay | Admitting: *Deleted

## 2015-06-26 DIAGNOSIS — M4712 Other spondylosis with myelopathy, cervical region: Secondary | ICD-10-CM | POA: Diagnosis not present

## 2015-06-26 DIAGNOSIS — G894 Chronic pain syndrome: Secondary | ICD-10-CM | POA: Diagnosis not present

## 2015-06-27 ENCOUNTER — Encounter (HOSPITAL_BASED_OUTPATIENT_CLINIC_OR_DEPARTMENT_OTHER): Payer: Self-pay | Admitting: *Deleted

## 2015-06-27 ENCOUNTER — Encounter (HOSPITAL_BASED_OUTPATIENT_CLINIC_OR_DEPARTMENT_OTHER)
Admission: RE | Disposition: A | Discharge status: Home / Self Care | Payer: Self-pay | Source: Ambulatory Visit | Attending: Orthopedic Surgery

## 2015-06-27 ENCOUNTER — Ambulatory Visit (HOSPITAL_BASED_OUTPATIENT_CLINIC_OR_DEPARTMENT_OTHER): Payer: Medicare Other | Admitting: Anesthesiology

## 2015-06-27 ENCOUNTER — Ambulatory Visit (HOSPITAL_BASED_OUTPATIENT_CLINIC_OR_DEPARTMENT_OTHER)
Admission: RE | Admit: 2015-06-27 | Discharge: 2015-06-27 | Disposition: A | Discharge status: Home / Self Care | Payer: Medicare Other | Source: Ambulatory Visit | Attending: Orthopedic Surgery | Admitting: Orthopedic Surgery

## 2015-06-27 DIAGNOSIS — S52201A Unspecified fracture of shaft of right ulna, initial encounter for closed fracture: Secondary | ICD-10-CM | POA: Insufficient documentation

## 2015-06-27 DIAGNOSIS — Z853 Personal history of malignant neoplasm of breast: Secondary | ICD-10-CM | POA: Diagnosis not present

## 2015-06-27 DIAGNOSIS — Y92003 Bedroom of unspecified non-institutional (private) residence as the place of occurrence of the external cause: Secondary | ICD-10-CM | POA: Diagnosis not present

## 2015-06-27 DIAGNOSIS — M79631 Pain in right forearm: Secondary | ICD-10-CM | POA: Diagnosis not present

## 2015-06-27 DIAGNOSIS — W06XXXA Fall from bed, initial encounter: Secondary | ICD-10-CM | POA: Insufficient documentation

## 2015-06-27 DIAGNOSIS — Z79899 Other long term (current) drug therapy: Secondary | ICD-10-CM | POA: Diagnosis not present

## 2015-06-27 DIAGNOSIS — I10 Essential (primary) hypertension: Secondary | ICD-10-CM | POA: Diagnosis not present

## 2015-06-27 DIAGNOSIS — G8918 Other acute postprocedural pain: Secondary | ICD-10-CM | POA: Diagnosis not present

## 2015-06-27 HISTORY — PX: ORIF ULNAR FRACTURE: SHX5417

## 2015-06-27 HISTORY — DX: Dorsalgia, unspecified: M54.9

## 2015-06-27 HISTORY — DX: Unspecified fracture of shaft of unspecified ulna, initial encounter for closed fracture: S52.209A

## 2015-06-27 HISTORY — DX: Other chronic pain: G89.29

## 2015-06-27 SURGERY — OPEN REDUCTION INTERNAL FIXATION (ORIF) ULNAR FRACTURE
Anesthesia: Monitor Anesthesia Care | Site: Arm Lower | Laterality: Right

## 2015-06-27 MED ORDER — ONDANSETRON HCL 4 MG PO TABS: 4.0000 mg | ORAL_TABLET | Freq: Three times a day (TID) | ORAL | Status: DC | PRN

## 2015-06-27 MED ORDER — PROPOFOL 500 MG/50ML IV EMUL
INTRAVENOUS | Status: DC | PRN
  Administered 2015-06-27: 100 ug via INTRAVENOUS

## 2015-06-27 MED ORDER — ONDANSETRON HCL 4 MG/2ML IJ SOLN
INTRAMUSCULAR | Status: DC | PRN
  Administered 2015-06-27: 4 mg via INTRAVENOUS

## 2015-06-27 MED ORDER — CEFAZOLIN SODIUM-DEXTROSE 2-4 GM/100ML-% IV SOLN
2.0000 g | INTRAVENOUS | Status: AC
  Administered 2015-06-27: 2 g via INTRAVENOUS

## 2015-06-27 MED ORDER — FENTANYL CITRATE (PF) 100 MCG/2ML IJ SOLN: 25.0000 ug | INTRAMUSCULAR | Status: DC | PRN

## 2015-06-27 MED ORDER — GLYCOPYRROLATE 0.2 MG/ML IJ SOLN: 0.2000 mg | Freq: Once | INTRAMUSCULAR | Status: DC | PRN

## 2015-06-27 MED ORDER — FENTANYL CITRATE (PF) 100 MCG/2ML IJ SOLN
INTRAMUSCULAR | Status: AC
  Filled 2015-06-27: qty 2

## 2015-06-27 MED ORDER — MIDAZOLAM HCL 2 MG/2ML IJ SOLN
INTRAMUSCULAR | Status: AC
  Filled 2015-06-27: qty 2

## 2015-06-27 MED ORDER — PROPOFOL 500 MG/50ML IV EMUL
INTRAVENOUS | Status: DC | PRN
  Administered 2015-06-27: 100 ug/kg/min via INTRAVENOUS

## 2015-06-27 MED ORDER — CEFAZOLIN SODIUM-DEXTROSE 2-4 GM/100ML-% IV SOLN
INTRAVENOUS | Status: AC
  Filled 2015-06-27: qty 100

## 2015-06-27 MED ORDER — LACTATED RINGERS IV SOLN
INTRAVENOUS | Status: DC
  Administered 2015-06-27: 12:00:00 via INTRAVENOUS

## 2015-06-27 MED ORDER — ONDANSETRON HCL 4 MG/2ML IJ SOLN: 4.0000 mg | Freq: Once | INTRAMUSCULAR | Status: DC | PRN

## 2015-06-27 MED ORDER — PROPOFOL 500 MG/50ML IV EMUL
INTRAVENOUS | Status: AC
  Filled 2015-06-27: qty 50

## 2015-06-27 MED ORDER — BUPIVACAINE-EPINEPHRINE (PF) 0.5% -1:200000 IJ SOLN
INTRAMUSCULAR | Status: DC | PRN
  Administered 2015-06-27: 20 mL via PERINEURAL

## 2015-06-27 MED ORDER — OXYCODONE-ACETAMINOPHEN 5-325 MG PO TABS: 1.0000 | ORAL_TABLET | ORAL | Status: DC | PRN

## 2015-06-27 MED ORDER — PHENYLEPHRINE 40 MCG/ML (10ML) SYRINGE FOR IV PUSH (FOR BLOOD PRESSURE SUPPORT)
PREFILLED_SYRINGE | INTRAVENOUS | Status: AC
Start: 2015-06-27 — End: 2015-06-27
  Filled 2015-06-27: qty 10

## 2015-06-27 MED ORDER — SCOPOLAMINE 1 MG/3DAYS TD PT72: 1.0000 | MEDICATED_PATCH | Freq: Once | TRANSDERMAL | Status: DC | PRN

## 2015-06-27 MED ORDER — DEXAMETHASONE SODIUM PHOSPHATE 4 MG/ML IJ SOLN
INTRAMUSCULAR | Status: DC | PRN
Start: 2015-06-27 — End: 2015-06-27
  Administered 2015-06-27: 10 mg via INTRAVENOUS

## 2015-06-27 MED ORDER — CHLORHEXIDINE GLUCONATE 4 % EX LIQD: 60.0000 mL | Freq: Once | CUTANEOUS | Status: DC

## 2015-06-27 MED ORDER — LIDOCAINE HCL (CARDIAC) 20 MG/ML IV SOLN
INTRAVENOUS | Status: AC
  Filled 2015-06-27: qty 5

## 2015-06-27 MED ORDER — FENTANYL CITRATE (PF) 100 MCG/2ML IJ SOLN
50.0000 ug | INTRAMUSCULAR | Status: DC | PRN
  Administered 2015-06-27: 50 ug via INTRAVENOUS
  Administered 2015-06-27: 25 ug via INTRAVENOUS

## 2015-06-27 MED ORDER — PHENYLEPHRINE HCL 10 MG/ML IJ SOLN
INTRAMUSCULAR | Status: DC | PRN
  Administered 2015-06-27: 20 ug via INTRAVENOUS
  Administered 2015-06-27: 40 ug via INTRAVENOUS
  Administered 2015-06-27: 60 ug via INTRAVENOUS
  Administered 2015-06-27: 40 ug via INTRAVENOUS

## 2015-06-27 MED ORDER — LIDOCAINE HCL (CARDIAC) 20 MG/ML IV SOLN
INTRAVENOUS | Status: DC | PRN
  Administered 2015-06-27: 50 mg via INTRAVENOUS

## 2015-06-27 MED ORDER — DEXAMETHASONE SODIUM PHOSPHATE 10 MG/ML IJ SOLN
INTRAMUSCULAR | Status: AC
  Filled 2015-06-27: qty 1

## 2015-06-27 MED ORDER — ONDANSETRON HCL 4 MG/2ML IJ SOLN
INTRAMUSCULAR | Status: AC
  Filled 2015-06-27: qty 2

## 2015-06-27 MED ORDER — LACTATED RINGERS IV SOLN: INTRAVENOUS | Status: DC

## 2015-06-27 MED ORDER — PROPOFOL 10 MG/ML IV BOLUS
INTRAVENOUS | Status: AC
  Filled 2015-06-27: qty 20

## 2015-06-27 MED ORDER — MIDAZOLAM HCL 2 MG/2ML IJ SOLN: 1.0000 mg | INTRAMUSCULAR | Status: DC | PRN

## 2015-06-27 SURGICAL SUPPLY — 70 items
APL SKNCLS STERI-STRIP NONHPOA (GAUZE/BANDAGES/DRESSINGS)
BANDAGE ACE 3X5.8 VEL STRL LF (GAUZE/BANDAGES/DRESSINGS) ×2 IMPLANT
BANDAGE ACE 4X5 VEL STRL LF (GAUZE/BANDAGES/DRESSINGS) ×2 IMPLANT
BENZOIN TINCTURE PRP APPL 2/3 (GAUZE/BANDAGES/DRESSINGS) IMPLANT
BLADE SURG 15 STRL LF DISP TIS (BLADE) ×1 IMPLANT
BLADE SURG 15 STRL SS (BLADE) ×3
BNDG CMPR 9X4 STRL LF SNTH (GAUZE/BANDAGES/DRESSINGS) ×1
BNDG COHESIVE 4X5 TAN STRL (GAUZE/BANDAGES/DRESSINGS) ×3 IMPLANT
BNDG CONFORM 3 STRL LF (GAUZE/BANDAGES/DRESSINGS) IMPLANT
BNDG ESMARK 4X9 LF (GAUZE/BANDAGES/DRESSINGS) ×2 IMPLANT
CANISTER SUCT 1200ML W/VALVE (MISCELLANEOUS) ×3 IMPLANT
CLOSURE WOUND 1/2 X4 (GAUZE/BANDAGES/DRESSINGS)
COVER BACK TABLE 60X90IN (DRAPES) ×3 IMPLANT
COVER MAYO STAND STRL (DRAPES) ×3 IMPLANT
CUFF TOURNIQUET SINGLE 18IN (TOURNIQUET CUFF) ×2 IMPLANT
DECANTER SPIKE VIAL GLASS SM (MISCELLANEOUS) IMPLANT
DRAPE EXTREMITY T 121X128X90 (DRAPE) ×3 IMPLANT
DRAPE OEC MINIVIEW 54X84 (DRAPES) ×2 IMPLANT
DRAPE U-SHAPE 47X51 STRL (DRAPES) ×2 IMPLANT
DURAPREP 26ML APPLICATOR (WOUND CARE) ×3 IMPLANT
ELECT REM PT RETURN 9FT ADLT (ELECTROSURGICAL) ×3
ELECTRODE REM PT RTRN 9FT ADLT (ELECTROSURGICAL) ×1 IMPLANT
GAUZE SPONGE 4X4 12PLY STRL (GAUZE/BANDAGES/DRESSINGS) ×3 IMPLANT
GAUZE XEROFORM 1X8 LF (GAUZE/BANDAGES/DRESSINGS) ×2 IMPLANT
GLOVE BIOGEL PI IND STRL 7.0 (GLOVE) ×1 IMPLANT
GLOVE BIOGEL PI INDICATOR 7.0 (GLOVE) ×4
GLOVE ECLIPSE 7.0 STRL STRAW (GLOVE) ×5 IMPLANT
GLOVE SURG ORTHO 8.0 STRL STRW (GLOVE) ×3 IMPLANT
GLOVE SURG SS PI 7.0 STRL IVOR (GLOVE) ×4 IMPLANT
GLOVE SURG SS PI 8.0 STRL IVOR (GLOVE) ×2 IMPLANT
GOWN STRL REUS W/ TWL LRG LVL3 (GOWN DISPOSABLE) ×2 IMPLANT
GOWN STRL REUS W/ TWL XL LVL3 (GOWN DISPOSABLE) ×1 IMPLANT
GOWN STRL REUS W/TWL LRG LVL3 (GOWN DISPOSABLE) ×6
GOWN STRL REUS W/TWL XL LVL3 (GOWN DISPOSABLE) ×6 IMPLANT
KIT 1/3 TUB PL 6H 73M (Orthopedic Implant) IMPLANT
NDL HYPO 25X1 1.5 SAFETY (NEEDLE) IMPLANT
NEEDLE HYPO 25X1 1.5 SAFETY (NEEDLE) IMPLANT
NS IRRIG 1000ML POUR BTL (IV SOLUTION) ×3 IMPLANT
PACK BASIN DAY SURGERY FS (CUSTOM PROCEDURE TRAY) ×3 IMPLANT
PAD CAST 3X4 CTTN HI CHSV (CAST SUPPLIES) ×2 IMPLANT
PAD CAST 4YDX4 CTTN HI CHSV (CAST SUPPLIES) IMPLANT
PADDING CAST ABS 4INX4YD NS (CAST SUPPLIES) ×2
PADDING CAST ABS COTTON 4X4 ST (CAST SUPPLIES) ×1 IMPLANT
PADDING CAST COTTON 3X4 STRL (CAST SUPPLIES) ×6
PADDING CAST COTTON 4X4 STRL (CAST SUPPLIES)
PENCIL BUTTON HOLSTER BLD 10FT (ELECTRODE) ×3 IMPLANT
PROS 1/3 TUB PL 6H 73M (Orthopedic Implant) ×3 IMPLANT
SCREW CORTEX 3.5 12MM (Screw) ×4 IMPLANT
SCREW CORTEX 3.5 14MM (Screw) ×10 IMPLANT
SCREW LOCK CORT ST 3.5X12 (Screw) IMPLANT
SCREW LOCK CORT ST 3.5X14 (Screw) IMPLANT
SLEEVE SCD COMPRESS KNEE MED (MISCELLANEOUS) ×2 IMPLANT
SPLINT PLASTER CAST XFAST 3X15 (CAST SUPPLIES) IMPLANT
SPLINT PLASTER XTRA FASTSET 3X (CAST SUPPLIES)
STAPLER VISISTAT 35W (STAPLE) IMPLANT
STOCKINETTE 4X48 STRL (DRAPES) ×3 IMPLANT
STRIP CLOSURE SKIN 1/2X4 (GAUZE/BANDAGES/DRESSINGS) IMPLANT
SUCTION FRAZIER HANDLE 10FR (MISCELLANEOUS) ×2
SUCTION TUBE FRAZIER 10FR DISP (MISCELLANEOUS) IMPLANT
SUT ETHILON 3 0 PS 1 (SUTURE) IMPLANT
SUT VIC AB 2-0 SH 27 (SUTURE) ×6
SUT VIC AB 2-0 SH 27XBRD (SUTURE) ×1 IMPLANT
SUT VIC AB 3-0 SH 27 (SUTURE)
SUT VIC AB 3-0 SH 27X BRD (SUTURE) IMPLANT
SYR BULB 3OZ (MISCELLANEOUS) ×3 IMPLANT
SYR CONTROL 10ML LL (SYRINGE) IMPLANT
TOWEL OR 17X24 6PK STRL BLUE (TOWEL DISPOSABLE) ×5 IMPLANT
TUBE CONNECTING 20'X1/4 (TUBING) ×1
TUBE CONNECTING 20X1/4 (TUBING) ×2 IMPLANT
UNDERPAD 30X30 (UNDERPADS AND DIAPERS) ×3 IMPLANT

## 2015-06-27 NOTE — Discharge Instructions (Signed)
Wear sling until block has worn off.  Do not remove splint.  Do not get splint wet.  May apply ice for up to 20 min at a time for pain and swelling.  Follow up appointment in one week.  SEEK MEDICAL CARE IF: You have swelling of your calf or leg.  You develop shortness of breath or chest pain.  You have redness, swelling, or increasing pain in the wound.  There is pus or any unusual drainage coming from the surgical site.  You notice a bad smell coming from the surgical site or dressing.  The surgical site breaks open after sutures or staples have been removed.  There is persistent bleeding from the suture or staple line.  You are getting worse or are not improving.  You have any other questions or concerns.  SEEK IMMEDIATE MEDICAL CARE IF:  You have a fever.  You develop a rash.  You have difficulty breathing.  You develop any reaction or side effects to medicines given.  Your knee motion is decreasing rather than improving.  MAKE SURE YOU:  Understand these instructions.  Will watch your condition.  Will get help right away if you are not doing well or get worse.   Regional Anesthesia Blocks  1. Numbness or the inability to move the "blocked" extremity may last from 3-48 hours after placement. The length of time depends on the medication injected and your individual response to the medication. If the numbness is not going away after 48 hours, call your surgeon.  2. The extremity that is blocked will need to be protected until the numbness is gone and the  Strength has returned. Because you cannot feel it, you will need to take extra care to avoid injury. Because it may be weak, you may have difficulty moving it or using it. You may not know what position it is in without looking at it while the block is in effect.  3. For blocks in the legs and feet, returning to weight bearing and walking needs to be done carefully. You will need to wait until the numbness is entirely gone and the  strength has returned. You should be able to move your leg and foot normally before you try and bear weight or walk. You will need someone to be with you when you first try to ensure you do not fall and possibly risk injury.  4. Bruising and tenderness at the needle site are common side effects and will resolve in a few days.  5. Persistent numbness or new problems with movement should be communicated to the surgeon or the [place] [phone] [place] [phone]).       Post Anesthesia Home Care Instructions  Activity: Get plenty of rest for the remainder of the day. A responsible adult should stay with you for 24 hours following the procedure.  For the next 24 hours, DO NOT: -Drive a car -Paediatric nurse -Drink alcoholic beverages -Take any medication unless instructed by your physician -Make any legal decisions or sign important papers.  Meals: Start with liquid foods such as gelatin or soup. Progress to regular foods as tolerated. Avoid greasy, spicy, heavy foods. If nausea and/or vomiting occur, drink only clear liquids until the nausea and/or vomiting subsides. Call your physician if vomiting continues.  Special Instructions/Symptoms: Your throat may feel dry or sore from the anesthesia or the breathing tube placed in your throat during surgery. If this causes discomfort, gargle with warm salt  water. The discomfort should disappear within 24 hours.  If you had a scopolamine patch placed behind your ear for the management of post- operative nausea and/or vomiting:  1. The medication in the patch is effective for 72 hours, after which it should be removed.  Wrap patch in a tissue and discard in the trash. Wash hands thoroughly with soap and water. 2. You may remove the patch earlier than 72 hours if you experience unpleasant side effects which may include dry mouth, dizziness or visual disturbances. 3. Avoid touching the patch. Wash your  hands with soap and water after contact with the patch.

## 2015-06-27 NOTE — Anesthesia Postprocedure Evaluation (Signed)
Anesthesia Post Note  Patient: Colleen [name]  Procedure(s) Performed: Procedure(s) (LRB): OPEN REDUCTION INTERNAL FIXATION (ORIF) ULNAR FRACTURE (Right)  Patient location during evaluation: PACU Anesthesia Type: General and Regional Level of consciousness: awake and alert Pain management: pain level controlled Vital Signs Assessment: post-procedure vital signs reviewed and stable Respiratory status: spontaneous breathing, nonlabored ventilation, respiratory function stable and patient connected to nasal cannula oxygen Cardiovascular status: blood pressure returned to baseline and stable Postop Assessment: no signs of nausea or vomiting Anesthetic complications: no Comments: Converted to GA from MAC intraop due to patient movement. Patient tolerated procedure well and block working well in PACU.    Last Vitals:  Filed Vitals:   06/27/15 1500 06/27/15 1515  BP: 143/78 158/78  Pulse: 77 85  Temp:    Resp: 14 18    Last Pain:  Filed Vitals:   06/27/15 1519  PainSc: 0-No pain                 Catalina Gravel

## 2015-06-27 NOTE — Anesthesia Preprocedure Evaluation (Addendum)
Anesthesia Evaluation  Patient identified by MRN, date of birth, ID band Patient awake    Reviewed: Allergy & Precautions, NPO status , Patient's Chart, lab work & pertinent test results  Airway Mallampati: II  TM Distance: >3 FB Neck ROM: Full    Dental  (+) Teeth Intact, Dental Advisory Given, Caps   Pulmonary neg pulmonary ROS,    Pulmonary exam normal breath sounds clear to auscultation       Cardiovascular hypertension, Pt. on medications Normal cardiovascular exam Rhythm:Regular Rate:Normal  Last echo reviewed 2012 from primary care doctor, clearance given   Neuro/Psych  Headaches,    GI/Hepatic negative GI ROS, Neg liver ROS,   Endo/Other  Hypothyroidism   Renal/GU negative Renal ROS     Musculoskeletal  (+) Arthritis , Right ulnar fracture Chronic back pain S/p ACDF   Abdominal   Peds  Hematology negative hematology ROS (+)   Anesthesia Other Findings Day of surgery medications reviewed with the patient.  Right breast cancer  Reproductive/Obstetrics                          Anesthesia Physical Anesthesia Plan  ASA: II  Anesthesia Plan: MAC and Regional   Post-op Pain Management:  Regional for Post-op pain   Induction: Intravenous  Airway Management Planned: Simple Face Mask  Additional Equipment:   Intra-op Plan:   Post-operative Plan:   Informed Consent: I have reviewed the patients History and Physical, chart, labs and discussed the procedure including the risks, benefits and alternatives for the proposed anesthesia with the patient or authorized representative who has indicated his/her understanding and acceptance.   Dental advisory given  Plan Discussed with: CRNA  Anesthesia Plan Comments: (Risks/benefits of regional block discussed with patient including risk of bleeding, infection, nerve damage, and possibility of failed block.  Also discussed backup plan of  general anesthesia and associated risks.  Patient wishes to proceed.)        Anesthesia Quick Evaluation

## 2015-06-27 NOTE — Progress Notes (Signed)
Assisted Dr. Turk with right, ultrasound guided, supraclavicular block. Side rails up, monitors on throughout procedure. See vital signs in flow sheet. Tolerated Procedure well. 

## 2015-06-27 NOTE — Transfer of Care (Signed)
Immediate Anesthesia Transfer of Care Note  Patient: Colleen [name]  Procedure(s) Performed: Procedure(s): OPEN REDUCTION INTERNAL FIXATION (ORIF) ULNAR FRACTURE (Right)  Patient Location: PACU  Anesthesia Type:General  Level of Consciousness: awake and sedated  Airway & Oxygen Therapy: Patient Spontanous Breathing and Patient connected to face mask oxygen  Post-op Assessment: Report given to RN and Post -op Vital signs reviewed and stable  Post vital signs: Reviewed and stable  Last Vitals:  Filed Vitals:   06/27/15 1250 06/27/15 1417  BP:    Pulse: 87 82  Resp: 18 15    Complications: No apparent anesthesia complications

## 2015-06-27 NOTE — Interval H&P Note (Signed)
History and Physical Interval Note:  06/27/2015 7:31 AM  Colleen [name]  has presented today for surgery, with the diagnosis of right ulnar shaft fracture  The various methods of treatment have been discussed with the patient and family. After consideration of risks, benefits and other options for treatment, the patient has consented to  Procedure(s): OPEN REDUCTION INTERNAL FIXATION (ORIF) ULNAR FRACTURE (Right) as a surgical intervention .  The patient's history has been reviewed, patient examined, no change in status, stable for surgery.  I have reviewed the patient's chart and labs.  Questions were answered to the patient's satisfaction.     [name]

## 2015-06-27 NOTE — Anesthesia Procedure Notes (Addendum)
Anesthesia Regional Block:  Supraclavicular block  Pre-Anesthetic Checklist: ,, timeout performed, Correct Patient, Correct Site, Correct Laterality, Correct Procedure, Correct Position, site marked, Risks and benefits discussed,  Surgical consent,  Pre-op evaluation,  At surgeon's request and post-op pain management  Laterality: Right  Prep: chloraprep       Needles:  Injection technique: Single-shot  Needle Type: Echogenic Needle     Needle Length: 9cm 9 cm Needle Gauge: 21 and 21 G    Additional Needles:  Procedures: ultrasound guided (picture in chart) Supraclavicular block Narrative:  Injection made incrementally with aspirations every 5 mL.  Performed by: Personally  Anesthesiologist: Catalina Gravel  Additional Notes: No pain on injection. No increased resistance to injection. Injection made in 5cc increments.  Good needle visualization.  Patient tolerated procedure well.   Procedure Name: LMA Insertion Performed by: Terrance Mass Pre-anesthesia Checklist: Patient identified, Emergency Drugs available, Suction available and Patient being monitored Patient Re-evaluated:Patient Re-evaluated prior to inductionOxygen Delivery Method: Circle System Utilized Preoxygenation: Pre-oxygenation with 100% oxygen Intubation Type: IV induction Ventilation: Mask ventilation without difficulty LMA: LMA inserted LMA Size: 4.0 Number of attempts: 1 Placement Confirmation: positive ETCO2 Tube secured with: Tape Dental Injury: Teeth and Oropharynx as per pre-operative assessment

## 2015-06-28 NOTE — Op Note (Deleted)
NAME:  Colleen [name], Colleen [name] NO.:  [id number]  MEDICAL RECORD NO.:  [id number]  LOCATION:                               FACILITY:  [facility]  PHYSICIAN:  [name], M.D. DATE OF BIRTH:  02-07-1935  DATE OF PROCEDURE:  06/27/2015 DATE OF DISCHARGE:  06/27/2015                              OPERATIVE REPORT   PREOPERATIVE DIAGNOSIS:  Displaced distal ulnar shaft fracture, right. Closed.  POSTOPERATIVE DIAGNOSIS:  Displaced distal ulnar shaft fracture, right. Closed.  PROCEDURE:  Open reduction and internal fixation utilizing a 6 hole 1/3rd tubular Synthes plate and screws.  SURGEON:  [name], MD  ASSISTANT:  Elmyra Ricks, PA  ANESTHESIA:  General.  BLOOD LOSS:  Minimal.  SPECIMENS:  None.  CULTURES:  None.  COMPLICATIONS:  None.  DRESSINGS:  Soft compressive well-padded short-arm splint.  TOURNIQUET TIME:  40 minutes.  DESCRIPTION OF PROCEDURE:  The patient was brought to operating room and after adequate anesthesia had been obtained, tourniquet applied. Prepped and draped in usual sterile fashion.  Exsanguinated with elevation of Esmarch.  Tourniquet inflated to 250 mmHg.  Fluoroscopic guidance.  Longitudinal incision over the ulnar shaft junction, middle distal 3rd.  Subperiosteal exposure.  Moderate osteopenia with some comminution and displacement.  Able to reduce this anatomically. Pleased with a 6-hole plate.  Firmly fixed across the entire fracture with 6 screws.  Pleased with alignment stability at completion.  Wound irrigated.  Closed subcutaneous subcuticular manner.  Sterile compressive dressing applied.  Well-padded, short-arm splint. Tourniquet deflated and removed.  Anesthesia reversed.  Brought to the recovery room.  Tolerated the surgery well, no complications.     [name], M.D.     DFM/MEDQ  D:  06/27/2015  T:  06/28/2015  Job:  [id number]

## 2015-06-28 NOTE — Op Note (Signed)
NAME:  Colleen [name], Colleen [name] NO.:  [id number]  MEDICAL RECORD NO.:  [id number]  LOCATION:                               FACILITY:  [facility]  PHYSICIAN:  [name], M.D. DATE OF BIRTH:  [birth year]  DATE OF PROCEDURE:  06/27/2015 DATE OF DISCHARGE:  06/27/2015                              OPERATIVE REPORT   PREOPERATIVE DIAGNOSIS:  Displaced distal ulnar shaft fracture, right. Closed.  POSTOPERATIVE DIAGNOSIS:  Displaced distal ulnar shaft fracture, right. Closed.  PROCEDURE:  Open reduction and internal fixation utilizing a 6 hole 1/3rd tubular Synthes plate and screws.  SURGEON:  [name], MD  ASSISTANT:  Elmyra Ricks, PA  ANESTHESIA:  General.  BLOOD LOSS:  Minimal.  SPECIMENS:  None.  CULTURES:  None.  COMPLICATIONS:  None.  DRESSINGS:  Soft compressive well-padded short-arm splint.  TOURNIQUET TIME:  40 minutes.  DESCRIPTION OF PROCEDURE:  The patient was brought to operating room and after adequate anesthesia had been obtained, tourniquet applied. Prepped and draped in usual sterile fashion.  Exsanguinated with elevation of Esmarch.  Tourniquet inflated to 250 mmHg.  Fluoroscopic guidance.  Longitudinal incision over the ulnar shaft junction, middle distal 3rd.  Subperiosteal exposure.  Moderate osteopenia with some comminution and displacement.  Able to reduce this anatomically. Pleased with a 6-hole plate.  Firmly fixed across the entire fracture with 6 screws.  Pleased with alignment stability at completion.  Wound irrigated.  Closed subcutaneous subcuticular manner.  Sterile compressive dressing applied.  Well-padded, short-arm splint. Tourniquet deflated and removed.  Anesthesia reversed.  Brought to the recovery room.  Tolerated the surgery well, no complications.     [name], M.D.     DFM/MEDQ  D:  06/27/2015  T:  06/28/2015  Job:  [id number]

## 2015-07-01 ENCOUNTER — Encounter (HOSPITAL_BASED_OUTPATIENT_CLINIC_OR_DEPARTMENT_OTHER): Payer: Self-pay | Admitting: Orthopedic Surgery

## 2015-07-05 DIAGNOSIS — S52201D Unspecified fracture of shaft of right ulna, subsequent encounter for closed fracture with routine healing: Secondary | ICD-10-CM | POA: Diagnosis not present

## 2015-07-12 DIAGNOSIS — M2400 Loose body in unspecified joint: Secondary | ICD-10-CM | POA: Diagnosis not present

## 2015-07-16 DIAGNOSIS — S52201D Unspecified fracture of shaft of right ulna, subsequent encounter for closed fracture with routine healing: Secondary | ICD-10-CM | POA: Diagnosis not present

## 2015-08-05 DIAGNOSIS — H40013 Open angle with borderline findings, low risk, bilateral: Secondary | ICD-10-CM | POA: Diagnosis not present

## 2015-08-05 DIAGNOSIS — H52223 Regular astigmatism, bilateral: Secondary | ICD-10-CM | POA: Diagnosis not present

## 2015-08-05 DIAGNOSIS — I10 Essential (primary) hypertension: Secondary | ICD-10-CM | POA: Diagnosis not present

## 2015-08-05 DIAGNOSIS — H43813 Vitreous degeneration, bilateral: Secondary | ICD-10-CM | POA: Diagnosis not present

## 2015-08-05 DIAGNOSIS — H26491 Other secondary cataract, right eye: Secondary | ICD-10-CM | POA: Diagnosis not present

## 2015-08-05 DIAGNOSIS — H5202 Hypermetropia, left eye: Secondary | ICD-10-CM | POA: Diagnosis not present

## 2015-08-05 DIAGNOSIS — H47233 Glaucomatous optic atrophy, bilateral: Secondary | ICD-10-CM | POA: Diagnosis not present

## 2015-08-05 DIAGNOSIS — H524 Presbyopia: Secondary | ICD-10-CM | POA: Diagnosis not present

## 2015-08-05 DIAGNOSIS — H26492 Other secondary cataract, left eye: Secondary | ICD-10-CM | POA: Diagnosis not present

## 2015-08-05 DIAGNOSIS — H43393 Other vitreous opacities, bilateral: Secondary | ICD-10-CM | POA: Diagnosis not present

## 2015-08-05 DIAGNOSIS — H43313 Vitreous membranes and strands, bilateral: Secondary | ICD-10-CM | POA: Diagnosis not present

## 2015-08-05 DIAGNOSIS — Z961 Presence of intraocular lens: Secondary | ICD-10-CM | POA: Diagnosis not present

## 2015-08-13 DIAGNOSIS — S52201D Unspecified fracture of shaft of right ulna, subsequent encounter for closed fracture with routine healing: Secondary | ICD-10-CM | POA: Diagnosis not present

## 2015-08-15 DIAGNOSIS — R55 Syncope and collapse: Secondary | ICD-10-CM | POA: Diagnosis not present

## 2015-08-15 DIAGNOSIS — M25552 Pain in left hip: Secondary | ICD-10-CM | POA: Diagnosis not present

## 2015-08-26 DIAGNOSIS — M5033 Other cervical disc degeneration, cervicothoracic region: Secondary | ICD-10-CM | POA: Diagnosis not present

## 2015-09-03 DIAGNOSIS — M7062 Trochanteric bursitis, left hip: Secondary | ICD-10-CM | POA: Diagnosis not present

## 2015-09-03 DIAGNOSIS — S52201D Unspecified fracture of shaft of right ulna, subsequent encounter for closed fracture with routine healing: Secondary | ICD-10-CM | POA: Diagnosis not present

## 2015-09-04 DIAGNOSIS — M4713 Other spondylosis with myelopathy, cervicothoracic region: Secondary | ICD-10-CM | POA: Diagnosis not present

## 2015-09-04 DIAGNOSIS — G894 Chronic pain syndrome: Secondary | ICD-10-CM | POA: Diagnosis not present

## 2015-09-04 DIAGNOSIS — M5033 Other cervical disc degeneration, cervicothoracic region: Secondary | ICD-10-CM | POA: Diagnosis not present

## 2015-09-04 DIAGNOSIS — M47897 Other spondylosis, lumbosacral region: Secondary | ICD-10-CM | POA: Diagnosis not present

## 2015-09-04 DIAGNOSIS — M542 Cervicalgia: Secondary | ICD-10-CM | POA: Diagnosis not present

## 2015-09-27 DIAGNOSIS — M4713 Other spondylosis with myelopathy, cervicothoracic region: Secondary | ICD-10-CM | POA: Diagnosis not present

## 2015-09-27 DIAGNOSIS — M542 Cervicalgia: Secondary | ICD-10-CM | POA: Diagnosis not present

## 2015-10-01 ENCOUNTER — Other Ambulatory Visit: Payer: Self-pay | Admitting: Orthopedic Surgery

## 2015-10-01 DIAGNOSIS — M25531 Pain in right wrist: Secondary | ICD-10-CM | POA: Diagnosis not present

## 2015-10-01 DIAGNOSIS — M7062 Trochanteric bursitis, left hip: Secondary | ICD-10-CM | POA: Diagnosis not present

## 2015-10-01 DIAGNOSIS — S52201D Unspecified fracture of shaft of right ulna, subsequent encounter for closed fracture with routine healing: Secondary | ICD-10-CM | POA: Diagnosis not present

## 2015-10-01 DIAGNOSIS — S72115D Nondisplaced fracture of greater trochanter of left femur, subsequent encounter for closed fracture with routine healing: Secondary | ICD-10-CM

## 2015-10-03 ENCOUNTER — Ambulatory Visit
Admission: RE | Admit: 2015-10-03 | Discharge: 2015-10-03 | Disposition: A | Discharge status: Home / Self Care | Payer: Medicare Other | Source: Ambulatory Visit | Attending: Orthopedic Surgery | Admitting: Orthopedic Surgery

## 2015-10-03 DIAGNOSIS — S72115D Nondisplaced fracture of greater trochanter of left femur, subsequent encounter for closed fracture with routine healing: Secondary | ICD-10-CM

## 2015-10-03 DIAGNOSIS — S32412A Displaced fracture of anterior wall of left acetabulum, initial encounter for closed fracture: Secondary | ICD-10-CM | POA: Diagnosis not present

## 2015-10-04 DIAGNOSIS — M5020 Other cervical disc displacement, unspecified cervical region: Secondary | ICD-10-CM | POA: Diagnosis not present

## 2015-10-08 DIAGNOSIS — Z981 Arthrodesis status: Secondary | ICD-10-CM | POA: Diagnosis not present

## 2015-10-08 DIAGNOSIS — G629 Polyneuropathy, unspecified: Secondary | ICD-10-CM | POA: Diagnosis not present

## 2015-10-08 DIAGNOSIS — M5023 Other cervical disc displacement, cervicothoracic region: Secondary | ICD-10-CM | POA: Diagnosis not present

## 2015-10-08 DIAGNOSIS — Z79899 Other long term (current) drug therapy: Secondary | ICD-10-CM | POA: Diagnosis not present

## 2015-10-08 DIAGNOSIS — M4322 Fusion of spine, cervical region: Secondary | ICD-10-CM | POA: Diagnosis not present

## 2015-10-08 DIAGNOSIS — M5003 Cervical disc disorder with myelopathy, cervicothoracic region: Secondary | ICD-10-CM | POA: Diagnosis not present

## 2015-10-08 DIAGNOSIS — I10 Essential (primary) hypertension: Secondary | ICD-10-CM | POA: Diagnosis not present

## 2015-10-08 DIAGNOSIS — M4713 Other spondylosis with myelopathy, cervicothoracic region: Secondary | ICD-10-CM | POA: Diagnosis not present

## 2015-10-08 DIAGNOSIS — M4313 Spondylolisthesis, cervicothoracic region: Secondary | ICD-10-CM | POA: Diagnosis not present

## 2015-10-08 DIAGNOSIS — Z853 Personal history of malignant neoplasm of breast: Secondary | ICD-10-CM | POA: Diagnosis not present

## 2015-10-08 DIAGNOSIS — Z9010 Acquired absence of unspecified breast and nipple: Secondary | ICD-10-CM | POA: Diagnosis not present

## 2015-10-08 DIAGNOSIS — E079 Disorder of thyroid, unspecified: Secondary | ICD-10-CM | POA: Diagnosis not present

## 2015-10-08 DIAGNOSIS — K219 Gastro-esophageal reflux disease without esophagitis: Secondary | ICD-10-CM | POA: Diagnosis not present

## 2015-10-08 DIAGNOSIS — M4312 Spondylolisthesis, cervical region: Secondary | ICD-10-CM | POA: Diagnosis not present

## 2015-10-09 DIAGNOSIS — M4322 Fusion of spine, cervical region: Secondary | ICD-10-CM | POA: Diagnosis not present

## 2015-10-09 DIAGNOSIS — K219 Gastro-esophageal reflux disease without esophagitis: Secondary | ICD-10-CM | POA: Diagnosis not present

## 2015-10-09 DIAGNOSIS — I10 Essential (primary) hypertension: Secondary | ICD-10-CM | POA: Diagnosis not present

## 2015-10-09 DIAGNOSIS — Z79899 Other long term (current) drug therapy: Secondary | ICD-10-CM | POA: Diagnosis not present

## 2015-10-09 DIAGNOSIS — M4713 Other spondylosis with myelopathy, cervicothoracic region: Secondary | ICD-10-CM | POA: Diagnosis not present

## 2015-10-09 DIAGNOSIS — M5003 Cervical disc disorder with myelopathy, cervicothoracic region: Secondary | ICD-10-CM | POA: Diagnosis not present

## 2015-10-09 DIAGNOSIS — M4313 Spondylolisthesis, cervicothoracic region: Secondary | ICD-10-CM | POA: Diagnosis not present

## 2015-10-17 DIAGNOSIS — R4689 Other symptoms and signs involving appearance and behavior: Secondary | ICD-10-CM | POA: Diagnosis not present

## 2015-10-17 DIAGNOSIS — F329 Major depressive disorder, single episode, unspecified: Secondary | ICD-10-CM | POA: Diagnosis not present

## 2015-11-06 DIAGNOSIS — R4689 Other symptoms and signs involving appearance and behavior: Secondary | ICD-10-CM | POA: Diagnosis not present

## 2015-11-06 DIAGNOSIS — R300 Dysuria: Secondary | ICD-10-CM | POA: Diagnosis not present

## 2015-11-12 DIAGNOSIS — M542 Cervicalgia: Secondary | ICD-10-CM | POA: Diagnosis not present

## 2015-11-26 DIAGNOSIS — M7062 Trochanteric bursitis, left hip: Secondary | ICD-10-CM | POA: Diagnosis not present

## 2015-11-29 DIAGNOSIS — Z780 Asymptomatic menopausal state: Secondary | ICD-10-CM | POA: Diagnosis not present

## 2015-11-29 DIAGNOSIS — E559 Vitamin D deficiency, unspecified: Secondary | ICD-10-CM | POA: Diagnosis not present

## 2015-11-29 DIAGNOSIS — R5383 Other fatigue: Secondary | ICD-10-CM | POA: Diagnosis not present

## 2015-11-29 DIAGNOSIS — M810 Age-related osteoporosis without current pathological fracture: Secondary | ICD-10-CM | POA: Diagnosis not present

## 2015-12-05 DIAGNOSIS — S32592A Other specified fracture of left pubis, initial encounter for closed fracture: Secondary | ICD-10-CM | POA: Diagnosis not present

## 2015-12-05 DIAGNOSIS — M810 Age-related osteoporosis without current pathological fracture: Secondary | ICD-10-CM | POA: Diagnosis not present

## 2015-12-10 DIAGNOSIS — Z79891 Long term (current) use of opiate analgesic: Secondary | ICD-10-CM | POA: Diagnosis not present

## 2015-12-10 DIAGNOSIS — Z79899 Other long term (current) drug therapy: Secondary | ICD-10-CM | POA: Diagnosis not present

## 2015-12-10 DIAGNOSIS — G894 Chronic pain syndrome: Secondary | ICD-10-CM | POA: Diagnosis not present

## 2015-12-25 DIAGNOSIS — F0390 Unspecified dementia without behavioral disturbance: Secondary | ICD-10-CM | POA: Diagnosis not present

## 2015-12-25 DIAGNOSIS — F333 Major depressive disorder, recurrent, severe with psychotic symptoms: Secondary | ICD-10-CM | POA: Diagnosis not present

## 2016-01-08 DIAGNOSIS — Z0000 Encounter for general adult medical examination without abnormal findings: Secondary | ICD-10-CM | POA: Diagnosis not present

## 2016-01-08 DIAGNOSIS — Z79899 Other long term (current) drug therapy: Secondary | ICD-10-CM | POA: Diagnosis not present

## 2016-01-08 DIAGNOSIS — Z1389 Encounter for screening for other disorder: Secondary | ICD-10-CM | POA: Diagnosis not present

## 2016-01-08 DIAGNOSIS — F0390 Unspecified dementia without behavioral disturbance: Secondary | ICD-10-CM | POA: Diagnosis not present

## 2016-01-08 DIAGNOSIS — E039 Hypothyroidism, unspecified: Secondary | ICD-10-CM | POA: Diagnosis not present

## 2016-01-08 DIAGNOSIS — F333 Major depressive disorder, recurrent, severe with psychotic symptoms: Secondary | ICD-10-CM | POA: Diagnosis not present

## 2016-01-09 DIAGNOSIS — M47892 Other spondylosis, cervical region: Secondary | ICD-10-CM | POA: Diagnosis not present

## 2016-01-09 DIAGNOSIS — M4712 Other spondylosis with myelopathy, cervical region: Secondary | ICD-10-CM | POA: Diagnosis not present

## 2016-01-09 DIAGNOSIS — G894 Chronic pain syndrome: Secondary | ICD-10-CM | POA: Diagnosis not present

## 2016-01-09 DIAGNOSIS — M542 Cervicalgia: Secondary | ICD-10-CM | POA: Diagnosis not present

## 2016-01-09 DIAGNOSIS — M791 Myalgia: Secondary | ICD-10-CM | POA: Diagnosis not present

## 2016-01-15 DIAGNOSIS — Z9882 Breast implant status: Secondary | ICD-10-CM | POA: Diagnosis not present

## 2016-01-15 DIAGNOSIS — Z1231 Encounter for screening mammogram for malignant neoplasm of breast: Secondary | ICD-10-CM | POA: Diagnosis not present

## 2016-01-17 DIAGNOSIS — E039 Hypothyroidism, unspecified: Secondary | ICD-10-CM | POA: Diagnosis not present

## 2016-01-17 DIAGNOSIS — R456 Violent behavior: Secondary | ICD-10-CM | POA: Diagnosis not present

## 2016-01-17 DIAGNOSIS — Z79899 Other long term (current) drug therapy: Secondary | ICD-10-CM | POA: Diagnosis not present

## 2016-01-17 DIAGNOSIS — J9620 Acute and chronic respiratory failure, unspecified whether with hypoxia or hypercapnia: Secondary | ICD-10-CM | POA: Diagnosis not present

## 2016-01-17 DIAGNOSIS — I10 Essential (primary) hypertension: Secondary | ICD-10-CM | POA: Diagnosis not present

## 2016-01-17 DIAGNOSIS — R45850 Homicidal ideations: Secondary | ICD-10-CM | POA: Diagnosis not present

## 2016-01-17 DIAGNOSIS — R4182 Altered mental status, unspecified: Secondary | ICD-10-CM | POA: Diagnosis not present

## 2016-01-17 DIAGNOSIS — F0391 Unspecified dementia with behavioral disturbance: Secondary | ICD-10-CM | POA: Diagnosis not present

## 2016-01-18 DIAGNOSIS — K5900 Constipation, unspecified: Secondary | ICD-10-CM | POA: Diagnosis not present

## 2016-01-18 DIAGNOSIS — F0391 Unspecified dementia with behavioral disturbance: Secondary | ICD-10-CM | POA: Diagnosis not present

## 2016-01-18 DIAGNOSIS — Z9189 Other specified personal risk factors, not elsewhere classified: Secondary | ICD-10-CM | POA: Diagnosis not present

## 2016-01-18 DIAGNOSIS — I341 Nonrheumatic mitral (valve) prolapse: Secondary | ICD-10-CM | POA: Diagnosis present

## 2016-01-18 DIAGNOSIS — S30811A Abrasion of abdominal wall, initial encounter: Secondary | ICD-10-CM | POA: Diagnosis not present

## 2016-01-18 DIAGNOSIS — Z96651 Presence of right artificial knee joint: Secondary | ICD-10-CM | POA: Diagnosis present

## 2016-01-18 DIAGNOSIS — F333 Major depressive disorder, recurrent, severe with psychotic symptoms: Secondary | ICD-10-CM | POA: Diagnosis not present

## 2016-01-18 DIAGNOSIS — E039 Hypothyroidism, unspecified: Secondary | ICD-10-CM | POA: Diagnosis present

## 2016-01-18 DIAGNOSIS — Z853 Personal history of malignant neoplasm of breast: Secondary | ICD-10-CM | POA: Diagnosis not present

## 2016-01-18 DIAGNOSIS — R456 Violent behavior: Secondary | ICD-10-CM | POA: Diagnosis not present

## 2016-01-18 DIAGNOSIS — Z91030 Bee allergy status: Secondary | ICD-10-CM | POA: Diagnosis not present

## 2016-01-18 DIAGNOSIS — R410 Disorientation, unspecified: Secondary | ICD-10-CM | POA: Diagnosis not present

## 2016-01-18 DIAGNOSIS — M1990 Unspecified osteoarthritis, unspecified site: Secondary | ICD-10-CM | POA: Diagnosis present

## 2016-01-18 DIAGNOSIS — F0390 Unspecified dementia without behavioral disturbance: Secondary | ICD-10-CM | POA: Diagnosis present

## 2016-01-18 DIAGNOSIS — F29 Unspecified psychosis not due to a substance or known physiological condition: Secondary | ICD-10-CM | POA: Diagnosis present

## 2016-01-18 DIAGNOSIS — E871 Hypo-osmolality and hyponatremia: Secondary | ICD-10-CM | POA: Diagnosis present

## 2016-01-18 DIAGNOSIS — M159 Polyosteoarthritis, unspecified: Secondary | ICD-10-CM | POA: Diagnosis not present

## 2016-01-18 DIAGNOSIS — Z880 Allergy status to penicillin: Secondary | ICD-10-CM | POA: Diagnosis not present

## 2016-01-18 DIAGNOSIS — Z888 Allergy status to other drugs, medicaments and biological substances status: Secondary | ICD-10-CM | POA: Diagnosis not present

## 2016-01-18 DIAGNOSIS — Z79899 Other long term (current) drug therapy: Secondary | ICD-10-CM | POA: Diagnosis not present

## 2016-01-18 DIAGNOSIS — Z91040 Latex allergy status: Secondary | ICD-10-CM | POA: Diagnosis not present

## 2016-01-18 DIAGNOSIS — S30811D Abrasion of abdominal wall, subsequent encounter: Secondary | ICD-10-CM | POA: Diagnosis not present

## 2016-01-18 DIAGNOSIS — I10 Essential (primary) hypertension: Secondary | ICD-10-CM | POA: Diagnosis present

## 2016-01-18 DIAGNOSIS — R7301 Impaired fasting glucose: Secondary | ICD-10-CM | POA: Diagnosis not present

## 2016-01-18 DIAGNOSIS — Z9181 History of falling: Secondary | ICD-10-CM | POA: Diagnosis not present

## 2016-01-27 DIAGNOSIS — F323 Major depressive disorder, single episode, severe with psychotic features: Secondary | ICD-10-CM | POA: Diagnosis not present

## 2016-01-27 DIAGNOSIS — G629 Polyneuropathy, unspecified: Secondary | ICD-10-CM | POA: Diagnosis not present

## 2016-01-27 DIAGNOSIS — T7411XS Adult physical abuse, confirmed, sequela: Secondary | ICD-10-CM | POA: Diagnosis not present

## 2016-01-31 DIAGNOSIS — F0390 Unspecified dementia without behavioral disturbance: Secondary | ICD-10-CM | POA: Diagnosis not present

## 2016-01-31 DIAGNOSIS — Z9011 Acquired absence of right breast and nipple: Secondary | ICD-10-CM | POA: Diagnosis not present

## 2016-01-31 DIAGNOSIS — Z733 Stress, not elsewhere classified: Secondary | ICD-10-CM | POA: Diagnosis not present

## 2016-01-31 DIAGNOSIS — R413 Other amnesia: Secondary | ICD-10-CM | POA: Diagnosis not present

## 2016-01-31 DIAGNOSIS — I672 Cerebral atherosclerosis: Secondary | ICD-10-CM | POA: Diagnosis not present

## 2016-01-31 DIAGNOSIS — Z853 Personal history of malignant neoplasm of breast: Secondary | ICD-10-CM | POA: Diagnosis not present

## 2016-02-04 DIAGNOSIS — M25561 Pain in right knee: Secondary | ICD-10-CM | POA: Diagnosis not present

## 2016-02-04 DIAGNOSIS — M25552 Pain in left hip: Secondary | ICD-10-CM | POA: Diagnosis not present

## 2016-02-05 DIAGNOSIS — F29 Unspecified psychosis not due to a substance or known physiological condition: Secondary | ICD-10-CM | POA: Diagnosis not present

## 2016-02-07 DIAGNOSIS — I6782 Cerebral ischemia: Secondary | ICD-10-CM | POA: Diagnosis not present

## 2016-02-10 DIAGNOSIS — G894 Chronic pain syndrome: Secondary | ICD-10-CM | POA: Diagnosis not present

## 2016-02-10 DIAGNOSIS — M4322 Fusion of spine, cervical region: Secondary | ICD-10-CM | POA: Diagnosis not present

## 2016-02-10 DIAGNOSIS — M791 Myalgia: Secondary | ICD-10-CM | POA: Diagnosis not present

## 2016-02-18 DIAGNOSIS — F0280 Dementia in other diseases classified elsewhere without behavioral disturbance: Secondary | ICD-10-CM | POA: Diagnosis not present

## 2016-02-18 DIAGNOSIS — G301 Alzheimer's disease with late onset: Secondary | ICD-10-CM | POA: Diagnosis not present

## 2016-02-19 DIAGNOSIS — F29 Unspecified psychosis not due to a substance or known physiological condition: Secondary | ICD-10-CM | POA: Diagnosis not present

## 2016-02-27 DIAGNOSIS — R0600 Dyspnea, unspecified: Secondary | ICD-10-CM | POA: Diagnosis not present

## 2016-02-27 DIAGNOSIS — R252 Cramp and spasm: Secondary | ICD-10-CM | POA: Diagnosis not present

## 2016-02-27 DIAGNOSIS — F411 Generalized anxiety disorder: Secondary | ICD-10-CM | POA: Diagnosis not present

## 2016-03-11 DIAGNOSIS — H6121 Impacted cerumen, right ear: Secondary | ICD-10-CM | POA: Diagnosis not present

## 2016-03-11 DIAGNOSIS — Z6827 Body mass index (BMI) 27.0-27.9, adult: Secondary | ICD-10-CM | POA: Diagnosis not present

## 2016-03-11 DIAGNOSIS — G894 Chronic pain syndrome: Secondary | ICD-10-CM | POA: Diagnosis not present

## 2016-03-11 DIAGNOSIS — M542 Cervicalgia: Secondary | ICD-10-CM | POA: Diagnosis not present

## 2016-03-24 DIAGNOSIS — M25552 Pain in left hip: Secondary | ICD-10-CM | POA: Diagnosis not present

## 2016-03-26 DIAGNOSIS — G301 Alzheimer's disease with late onset: Secondary | ICD-10-CM | POA: Diagnosis not present

## 2016-03-26 DIAGNOSIS — M810 Age-related osteoporosis without current pathological fracture: Secondary | ICD-10-CM | POA: Diagnosis not present

## 2016-03-26 DIAGNOSIS — R5383 Other fatigue: Secondary | ICD-10-CM | POA: Diagnosis not present

## 2016-03-26 DIAGNOSIS — F0280 Dementia in other diseases classified elsewhere without behavioral disturbance: Secondary | ICD-10-CM | POA: Diagnosis not present

## 2016-03-26 DIAGNOSIS — E559 Vitamin D deficiency, unspecified: Secondary | ICD-10-CM | POA: Diagnosis not present

## 2016-03-26 DIAGNOSIS — M25552 Pain in left hip: Secondary | ICD-10-CM | POA: Diagnosis not present

## 2016-04-06 DIAGNOSIS — F29 Unspecified psychosis not due to a substance or known physiological condition: Secondary | ICD-10-CM | POA: Diagnosis not present

## 2016-04-08 DIAGNOSIS — G894 Chronic pain syndrome: Secondary | ICD-10-CM | POA: Diagnosis not present

## 2016-04-08 DIAGNOSIS — M542 Cervicalgia: Secondary | ICD-10-CM | POA: Diagnosis not present

## 2016-04-08 DIAGNOSIS — M4322 Fusion of spine, cervical region: Secondary | ICD-10-CM | POA: Diagnosis not present

## 2016-05-06 DIAGNOSIS — M791 Myalgia: Secondary | ICD-10-CM | POA: Diagnosis not present

## 2016-05-06 DIAGNOSIS — M542 Cervicalgia: Secondary | ICD-10-CM | POA: Diagnosis not present

## 2016-05-06 DIAGNOSIS — G894 Chronic pain syndrome: Secondary | ICD-10-CM | POA: Diagnosis not present

## 2016-05-22 DIAGNOSIS — N390 Urinary tract infection, site not specified: Secondary | ICD-10-CM | POA: Diagnosis not present

## 2016-05-26 DIAGNOSIS — H47233 Glaucomatous optic atrophy, bilateral: Secondary | ICD-10-CM | POA: Diagnosis not present

## 2016-05-26 DIAGNOSIS — H40013 Open angle with borderline findings, low risk, bilateral: Secondary | ICD-10-CM | POA: Diagnosis not present

## 2016-05-26 DIAGNOSIS — I10 Essential (primary) hypertension: Secondary | ICD-10-CM | POA: Diagnosis not present

## 2016-05-27 DIAGNOSIS — J301 Allergic rhinitis due to pollen: Secondary | ICD-10-CM | POA: Diagnosis not present

## 2016-05-27 DIAGNOSIS — Z79899 Other long term (current) drug therapy: Secondary | ICD-10-CM | POA: Diagnosis not present

## 2016-05-27 DIAGNOSIS — F323 Major depressive disorder, single episode, severe with psychotic features: Secondary | ICD-10-CM | POA: Diagnosis not present

## 2016-05-27 DIAGNOSIS — E039 Hypothyroidism, unspecified: Secondary | ICD-10-CM | POA: Diagnosis not present

## 2016-06-01 DIAGNOSIS — F29 Unspecified psychosis not due to a substance or known physiological condition: Secondary | ICD-10-CM | POA: Diagnosis not present

## 2016-06-01 DIAGNOSIS — F419 Anxiety disorder, unspecified: Secondary | ICD-10-CM | POA: Diagnosis not present

## 2016-06-03 DIAGNOSIS — M4322 Fusion of spine, cervical region: Secondary | ICD-10-CM | POA: Diagnosis not present

## 2016-06-03 DIAGNOSIS — Z79891 Long term (current) use of opiate analgesic: Secondary | ICD-10-CM | POA: Diagnosis not present

## 2016-06-03 DIAGNOSIS — G894 Chronic pain syndrome: Secondary | ICD-10-CM | POA: Diagnosis not present

## 2016-06-03 DIAGNOSIS — Z79899 Other long term (current) drug therapy: Secondary | ICD-10-CM | POA: Diagnosis not present

## 2016-07-01 DIAGNOSIS — G894 Chronic pain syndrome: Secondary | ICD-10-CM | POA: Diagnosis not present

## 2016-07-01 DIAGNOSIS — M542 Cervicalgia: Secondary | ICD-10-CM | POA: Diagnosis not present

## 2016-07-08 DIAGNOSIS — Z6826 Body mass index (BMI) 26.0-26.9, adult: Secondary | ICD-10-CM | POA: Diagnosis not present

## 2016-07-08 DIAGNOSIS — R300 Dysuria: Secondary | ICD-10-CM | POA: Diagnosis not present

## 2016-07-29 DIAGNOSIS — G894 Chronic pain syndrome: Secondary | ICD-10-CM | POA: Diagnosis not present

## 2016-07-29 DIAGNOSIS — M5106 Intervertebral disc disorders with myelopathy, lumbar region: Secondary | ICD-10-CM | POA: Diagnosis not present

## 2016-07-29 DIAGNOSIS — M4322 Fusion of spine, cervical region: Secondary | ICD-10-CM | POA: Diagnosis not present

## 2016-07-29 DIAGNOSIS — M542 Cervicalgia: Secondary | ICD-10-CM | POA: Diagnosis not present

## 2016-07-29 DIAGNOSIS — M791 Myalgia: Secondary | ICD-10-CM | POA: Diagnosis not present

## 2016-08-19 DIAGNOSIS — R300 Dysuria: Secondary | ICD-10-CM | POA: Diagnosis not present

## 2016-08-19 DIAGNOSIS — Z6826 Body mass index (BMI) 26.0-26.9, adult: Secondary | ICD-10-CM | POA: Diagnosis not present

## 2016-08-24 DIAGNOSIS — F29 Unspecified psychosis not due to a substance or known physiological condition: Secondary | ICD-10-CM | POA: Diagnosis not present

## 2016-08-31 DIAGNOSIS — M5106 Intervertebral disc disorders with myelopathy, lumbar region: Secondary | ICD-10-CM | POA: Diagnosis not present

## 2016-08-31 DIAGNOSIS — M545 Low back pain: Secondary | ICD-10-CM | POA: Diagnosis not present

## 2016-08-31 DIAGNOSIS — M4322 Fusion of spine, cervical region: Secondary | ICD-10-CM | POA: Diagnosis not present

## 2016-08-31 DIAGNOSIS — G894 Chronic pain syndrome: Secondary | ICD-10-CM | POA: Diagnosis not present

## 2016-09-09 DIAGNOSIS — R300 Dysuria: Secondary | ICD-10-CM | POA: Diagnosis not present

## 2016-10-05 DIAGNOSIS — G894 Chronic pain syndrome: Secondary | ICD-10-CM | POA: Diagnosis not present

## 2016-10-05 DIAGNOSIS — M791 Myalgia: Secondary | ICD-10-CM | POA: Diagnosis not present

## 2016-10-05 DIAGNOSIS — M542 Cervicalgia: Secondary | ICD-10-CM | POA: Diagnosis not present

## 2016-10-21 DIAGNOSIS — Z6826 Body mass index (BMI) 26.0-26.9, adult: Secondary | ICD-10-CM | POA: Diagnosis not present

## 2016-10-21 DIAGNOSIS — R300 Dysuria: Secondary | ICD-10-CM | POA: Diagnosis not present

## 2016-10-22 DIAGNOSIS — G301 Alzheimer's disease with late onset: Secondary | ICD-10-CM | POA: Diagnosis not present

## 2016-10-22 DIAGNOSIS — F0280 Dementia in other diseases classified elsewhere without behavioral disturbance: Secondary | ICD-10-CM | POA: Diagnosis not present

## 2016-10-30 DIAGNOSIS — B029 Zoster without complications: Secondary | ICD-10-CM | POA: Diagnosis not present

## 2016-10-30 DIAGNOSIS — Z6826 Body mass index (BMI) 26.0-26.9, adult: Secondary | ICD-10-CM | POA: Diagnosis not present

## 2016-11-09 DIAGNOSIS — M542 Cervicalgia: Secondary | ICD-10-CM | POA: Diagnosis not present

## 2016-11-09 DIAGNOSIS — M4322 Fusion of spine, cervical region: Secondary | ICD-10-CM | POA: Diagnosis not present

## 2016-11-09 DIAGNOSIS — M5106 Intervertebral disc disorders with myelopathy, lumbar region: Secondary | ICD-10-CM | POA: Diagnosis not present

## 2016-11-09 DIAGNOSIS — G894 Chronic pain syndrome: Secondary | ICD-10-CM | POA: Diagnosis not present

## 2016-12-04 DIAGNOSIS — Z6826 Body mass index (BMI) 26.0-26.9, adult: Secondary | ICD-10-CM | POA: Diagnosis not present

## 2016-12-04 DIAGNOSIS — R300 Dysuria: Secondary | ICD-10-CM | POA: Diagnosis not present

## 2016-12-07 DIAGNOSIS — M4322 Fusion of spine, cervical region: Secondary | ICD-10-CM | POA: Diagnosis not present

## 2016-12-07 DIAGNOSIS — G894 Chronic pain syndrome: Secondary | ICD-10-CM | POA: Diagnosis not present

## 2016-12-07 DIAGNOSIS — Z79899 Other long term (current) drug therapy: Secondary | ICD-10-CM | POA: Diagnosis not present

## 2016-12-07 DIAGNOSIS — Z79891 Long term (current) use of opiate analgesic: Secondary | ICD-10-CM | POA: Diagnosis not present

## 2016-12-15 DIAGNOSIS — F29 Unspecified psychosis not due to a substance or known physiological condition: Secondary | ICD-10-CM | POA: Diagnosis not present

## 2016-12-17 DIAGNOSIS — N3000 Acute cystitis without hematuria: Secondary | ICD-10-CM | POA: Diagnosis not present

## 2016-12-17 DIAGNOSIS — N318 Other neuromuscular dysfunction of bladder: Secondary | ICD-10-CM | POA: Diagnosis not present

## 2017-01-04 DIAGNOSIS — M5106 Intervertebral disc disorders with myelopathy, lumbar region: Secondary | ICD-10-CM | POA: Diagnosis not present

## 2017-01-04 DIAGNOSIS — G894 Chronic pain syndrome: Secondary | ICD-10-CM | POA: Diagnosis not present

## 2017-01-11 DIAGNOSIS — Z23 Encounter for immunization: Secondary | ICD-10-CM | POA: Diagnosis not present

## 2017-01-11 DIAGNOSIS — H00024 Hordeolum internum left upper eyelid: Secondary | ICD-10-CM | POA: Diagnosis not present

## 2017-01-11 DIAGNOSIS — R600 Localized edema: Secondary | ICD-10-CM | POA: Diagnosis not present

## 2017-01-11 DIAGNOSIS — H00011 Hordeolum externum right upper eyelid: Secondary | ICD-10-CM | POA: Diagnosis not present

## 2017-01-11 DIAGNOSIS — Z6826 Body mass index (BMI) 26.0-26.9, adult: Secondary | ICD-10-CM | POA: Diagnosis not present

## 2017-01-18 DIAGNOSIS — Z1231 Encounter for screening mammogram for malignant neoplasm of breast: Secondary | ICD-10-CM | POA: Diagnosis not present

## 2017-01-21 DIAGNOSIS — F0390 Unspecified dementia without behavioral disturbance: Secondary | ICD-10-CM | POA: Diagnosis not present

## 2017-01-21 DIAGNOSIS — E039 Hypothyroidism, unspecified: Secondary | ICD-10-CM | POA: Diagnosis not present

## 2017-01-21 DIAGNOSIS — Z0000 Encounter for general adult medical examination without abnormal findings: Secondary | ICD-10-CM | POA: Diagnosis not present

## 2017-01-21 DIAGNOSIS — E782 Mixed hyperlipidemia: Secondary | ICD-10-CM | POA: Diagnosis not present

## 2017-01-21 DIAGNOSIS — Z79899 Other long term (current) drug therapy: Secondary | ICD-10-CM | POA: Diagnosis not present

## 2017-01-22 DIAGNOSIS — Z853 Personal history of malignant neoplasm of breast: Secondary | ICD-10-CM | POA: Diagnosis not present

## 2017-02-02 DIAGNOSIS — Z79899 Other long term (current) drug therapy: Secondary | ICD-10-CM | POA: Diagnosis not present

## 2017-02-02 DIAGNOSIS — R769 Abnormal immunological finding in serum, unspecified: Secondary | ICD-10-CM | POA: Diagnosis not present

## 2017-02-08 DIAGNOSIS — M542 Cervicalgia: Secondary | ICD-10-CM | POA: Diagnosis not present

## 2017-02-08 DIAGNOSIS — G894 Chronic pain syndrome: Secondary | ICD-10-CM | POA: Diagnosis not present

## 2017-02-08 DIAGNOSIS — M4322 Fusion of spine, cervical region: Secondary | ICD-10-CM | POA: Diagnosis not present

## 2017-02-10 DIAGNOSIS — E663 Overweight: Secondary | ICD-10-CM | POA: Diagnosis not present

## 2017-02-10 DIAGNOSIS — Z6827 Body mass index (BMI) 27.0-27.9, adult: Secondary | ICD-10-CM | POA: Diagnosis not present

## 2017-02-10 DIAGNOSIS — R609 Edema, unspecified: Secondary | ICD-10-CM | POA: Diagnosis not present

## 2017-03-04 DIAGNOSIS — M4322 Fusion of spine, cervical region: Secondary | ICD-10-CM | POA: Diagnosis not present

## 2017-03-04 DIAGNOSIS — M542 Cervicalgia: Secondary | ICD-10-CM | POA: Diagnosis not present

## 2017-03-04 DIAGNOSIS — G894 Chronic pain syndrome: Secondary | ICD-10-CM | POA: Diagnosis not present

## 2017-03-10 ENCOUNTER — Other Ambulatory Visit: Payer: Self-pay | Admitting: Orthopaedic Surgery

## 2017-03-10 DIAGNOSIS — M4322 Fusion of spine, cervical region: Secondary | ICD-10-CM

## 2017-03-12 DIAGNOSIS — Z6826 Body mass index (BMI) 26.0-26.9, adult: Secondary | ICD-10-CM | POA: Diagnosis not present

## 2017-03-12 DIAGNOSIS — R609 Edema, unspecified: Secondary | ICD-10-CM | POA: Diagnosis not present

## 2017-03-17 DIAGNOSIS — R600 Localized edema: Secondary | ICD-10-CM | POA: Diagnosis not present

## 2017-03-17 DIAGNOSIS — I359 Nonrheumatic aortic valve disorder, unspecified: Secondary | ICD-10-CM | POA: Diagnosis not present

## 2017-03-22 ENCOUNTER — Ambulatory Visit
Admission: RE | Admit: 2017-03-22 | Discharge: 2017-03-22 | Disposition: A | Discharge status: Home / Self Care | Payer: Medicare Other | Source: Ambulatory Visit | Attending: Orthopaedic Surgery | Admitting: Orthopaedic Surgery

## 2017-03-22 DIAGNOSIS — M4802 Spinal stenosis, cervical region: Secondary | ICD-10-CM | POA: Diagnosis not present

## 2017-03-22 DIAGNOSIS — M4322 Fusion of spine, cervical region: Secondary | ICD-10-CM

## 2017-03-25 DIAGNOSIS — M542 Cervicalgia: Secondary | ICD-10-CM | POA: Diagnosis not present

## 2017-03-25 DIAGNOSIS — M4322 Fusion of spine, cervical region: Secondary | ICD-10-CM | POA: Diagnosis not present

## 2017-03-25 DIAGNOSIS — G894 Chronic pain syndrome: Secondary | ICD-10-CM | POA: Diagnosis not present

## 2017-04-06 DIAGNOSIS — F29 Unspecified psychosis not due to a substance or known physiological condition: Secondary | ICD-10-CM | POA: Diagnosis not present

## 2017-04-16 DIAGNOSIS — E876 Hypokalemia: Secondary | ICD-10-CM | POA: Diagnosis not present

## 2017-04-16 DIAGNOSIS — N390 Urinary tract infection, site not specified: Secondary | ICD-10-CM | POA: Diagnosis not present

## 2017-04-16 DIAGNOSIS — Z6826 Body mass index (BMI) 26.0-26.9, adult: Secondary | ICD-10-CM | POA: Diagnosis not present

## 2017-04-22 DIAGNOSIS — F0280 Dementia in other diseases classified elsewhere without behavioral disturbance: Secondary | ICD-10-CM | POA: Diagnosis not present

## 2017-04-22 DIAGNOSIS — G301 Alzheimer's disease with late onset: Secondary | ICD-10-CM | POA: Diagnosis not present

## 2017-05-10 DIAGNOSIS — G894 Chronic pain syndrome: Secondary | ICD-10-CM | POA: Diagnosis not present

## 2017-05-10 DIAGNOSIS — M542 Cervicalgia: Secondary | ICD-10-CM | POA: Diagnosis not present

## 2017-05-10 DIAGNOSIS — M5412 Radiculopathy, cervical region: Secondary | ICD-10-CM | POA: Diagnosis not present

## 2017-05-10 DIAGNOSIS — M5106 Intervertebral disc disorders with myelopathy, lumbar region: Secondary | ICD-10-CM | POA: Diagnosis not present

## 2017-06-07 DIAGNOSIS — M7552 Bursitis of left shoulder: Secondary | ICD-10-CM | POA: Diagnosis not present

## 2017-06-07 DIAGNOSIS — M542 Cervicalgia: Secondary | ICD-10-CM | POA: Diagnosis not present

## 2017-06-07 DIAGNOSIS — Z79899 Other long term (current) drug therapy: Secondary | ICD-10-CM | POA: Diagnosis not present

## 2017-06-07 DIAGNOSIS — Z79891 Long term (current) use of opiate analgesic: Secondary | ICD-10-CM | POA: Diagnosis not present

## 2017-06-07 DIAGNOSIS — M5412 Radiculopathy, cervical region: Secondary | ICD-10-CM | POA: Diagnosis not present

## 2017-06-07 DIAGNOSIS — G894 Chronic pain syndrome: Secondary | ICD-10-CM | POA: Diagnosis not present

## 2017-07-12 DIAGNOSIS — M542 Cervicalgia: Secondary | ICD-10-CM | POA: Diagnosis not present

## 2017-07-12 DIAGNOSIS — M47892 Other spondylosis, cervical region: Secondary | ICD-10-CM | POA: Diagnosis not present

## 2017-07-12 DIAGNOSIS — G894 Chronic pain syndrome: Secondary | ICD-10-CM | POA: Diagnosis not present

## 2017-07-12 DIAGNOSIS — M47896 Other spondylosis, lumbar region: Secondary | ICD-10-CM | POA: Diagnosis not present

## 2017-07-21 IMAGING — MR MR CERVICAL SPINE W/O CM
4 of 5 series · 27 of 48 positions shown · non-contrast
Comparison: MRI cervical spine 07/26/2013. CT cervical spine
05/13/2010.

CLINICAL DATA: History of cervical surgery in 9597 and 8379. Neck
pain radiating into the posterior head and into both shoulders and
arms. Bilateral upper extremity numbness. Symptoms are chronic. No
known injury. Subsequent encounter.

EXAM:
MRI CERVICAL SPINE WITHOUT CONTRAST
TECHNIQUE: Multiplanar, multisequence MR imaging of the cervical spine was
performed. No intravenous contrast was administered.

[Series 3: T2 · sagittal · 3.0mm · 0.66mm/px · 6 of 13 slices shown (1 of 2)]
[im 1/13]
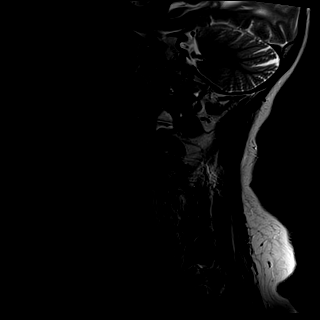
[im 3/13]
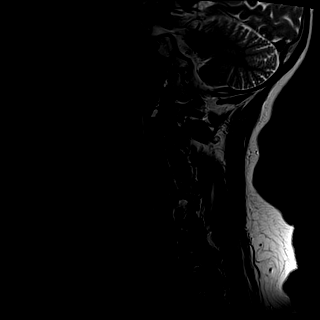
[im 5/13]
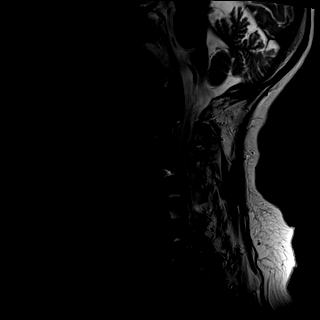
[im 8/13]
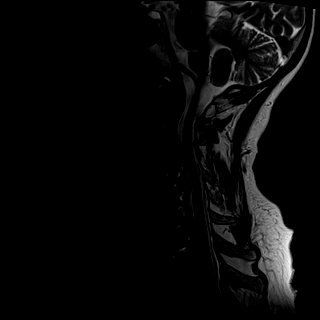
[im 10/13]
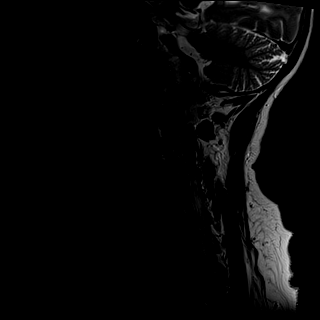
[im 13/13]
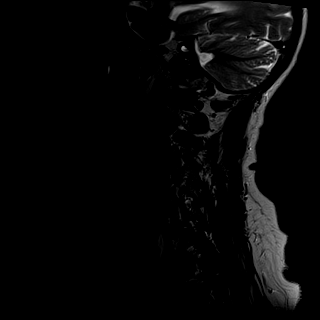

[Series 4: T1 · sagittal · 3.0mm · 0.41mm/px · 7 of 13 slices shown]
[im 1/13]
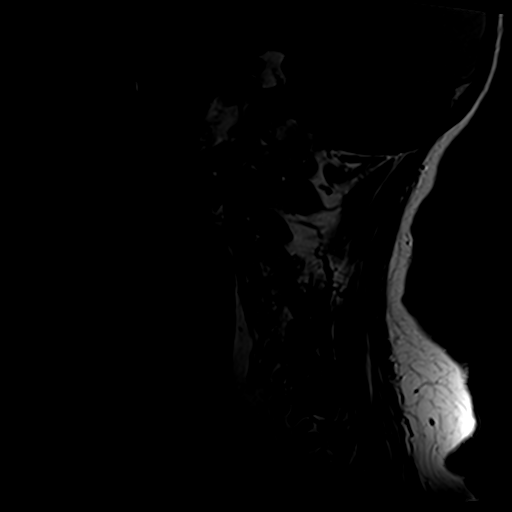
[im 3/13]
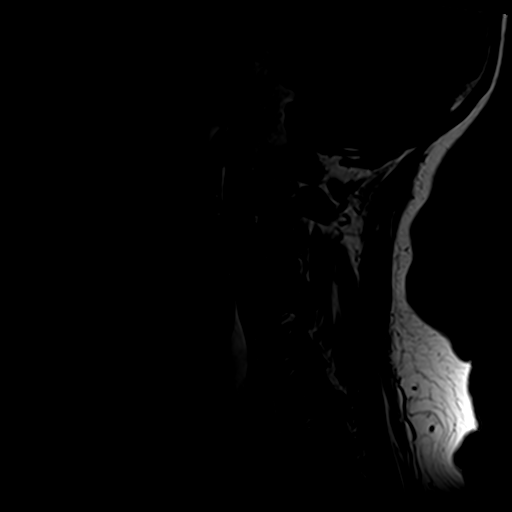
[im 5/13]
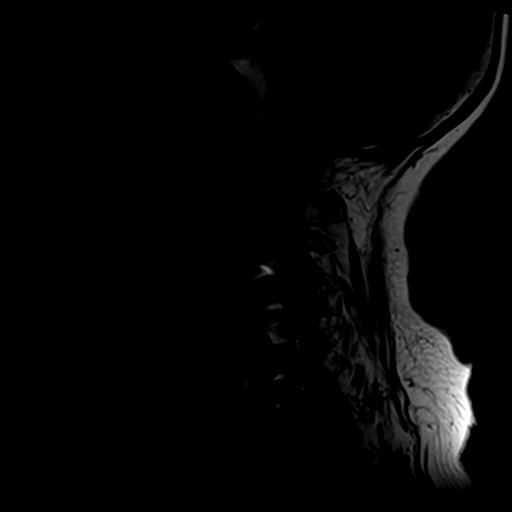
[im 7/13]
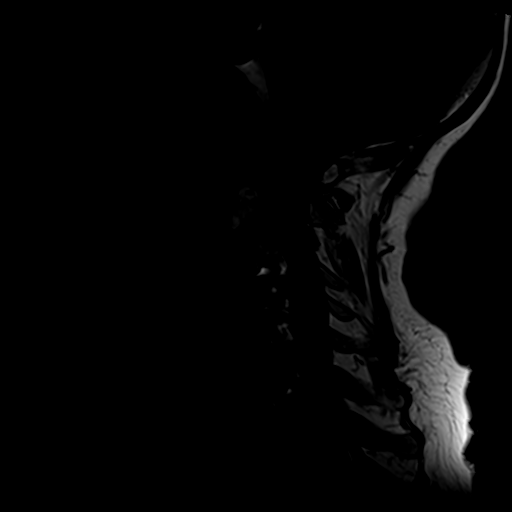
[im 9/13]
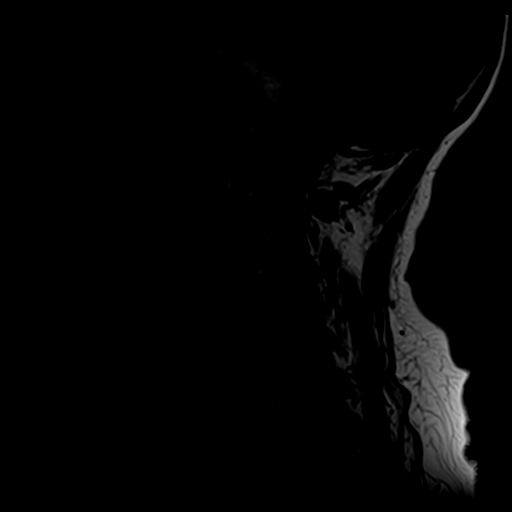
[im 11/13]
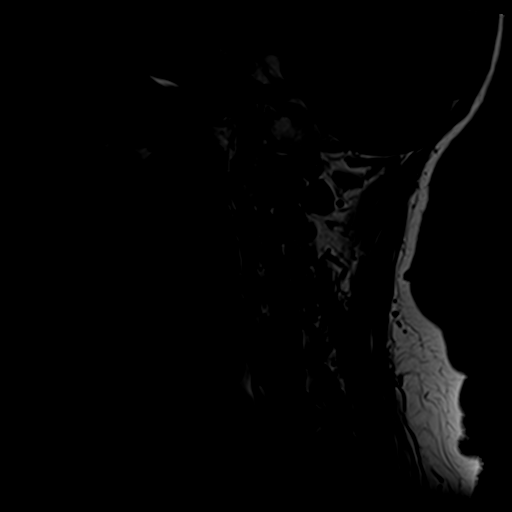
[im 13/13]
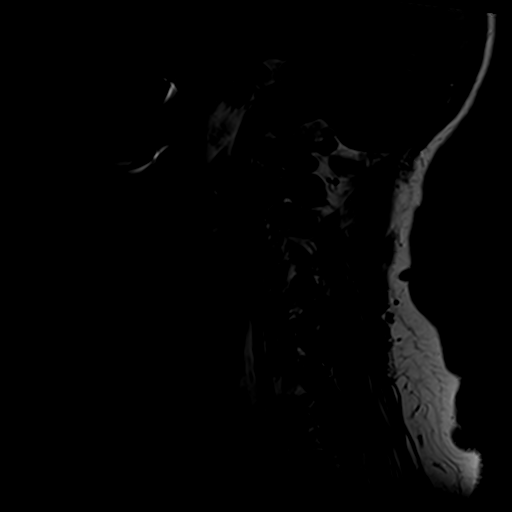

[Series 5: tir sag · sagittal · 3.0mm · 0.41mm/px · 6 of 13 slices shown]
[im 1/13]
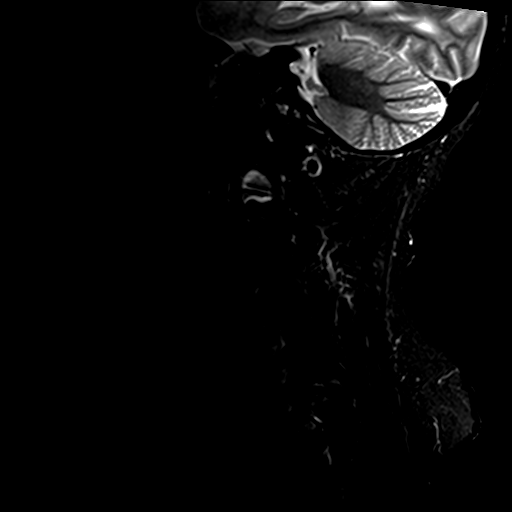
[im 3/13]
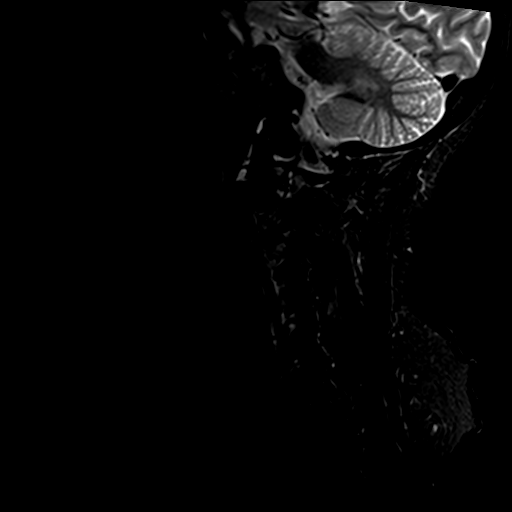
[im 5/13]
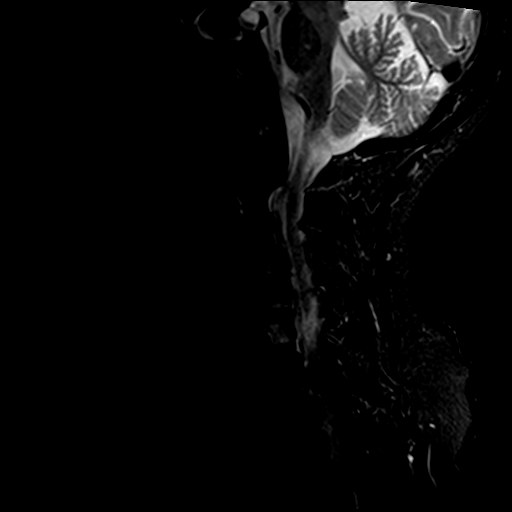
[im 7/13]
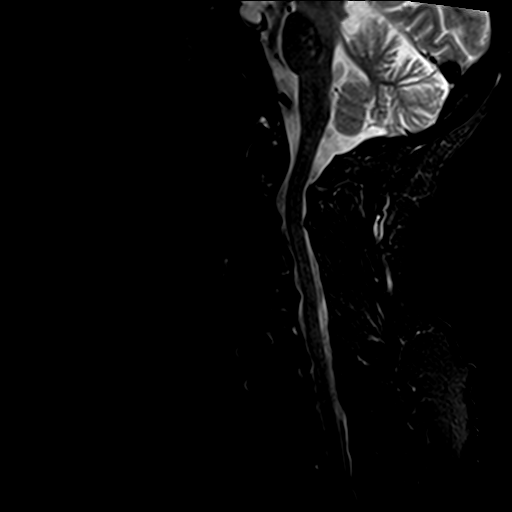
[im 9/13]
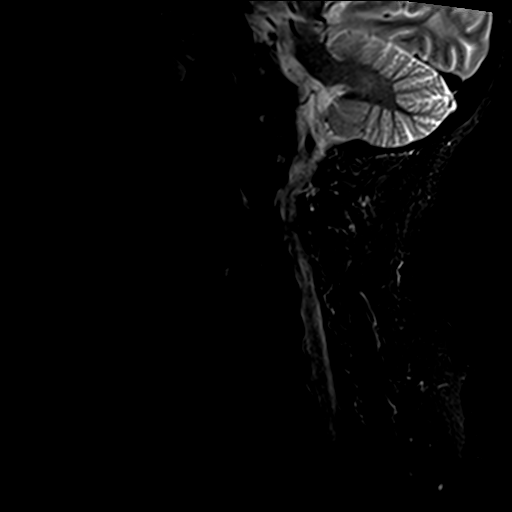
[im 11/13]
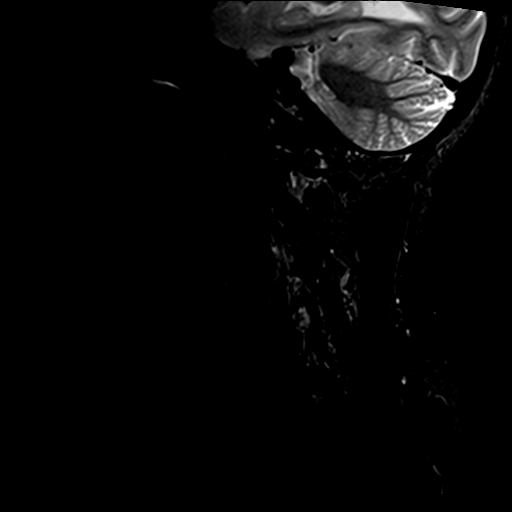

[Series 7: T2 · axial · 3.0mm · 0.47mm/px · z∈[-82,+15]mm · 8 of 27 slices shown (2 of 2)]
[im 1/27]
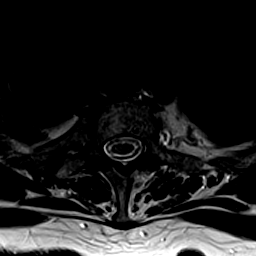
[im 5/27]
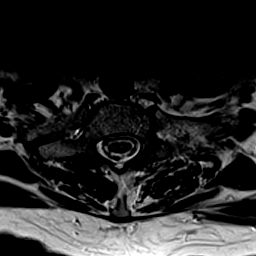
[im 9/27]
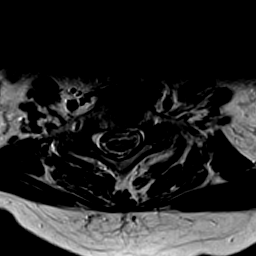
[im 13/27]
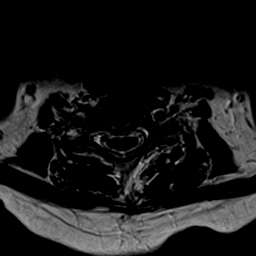
[im 15/27]
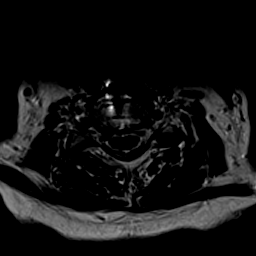
[im 19/27]
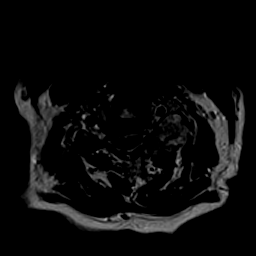
[im 23/27]
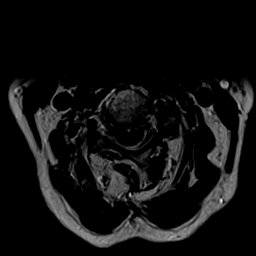
[im 27/27]
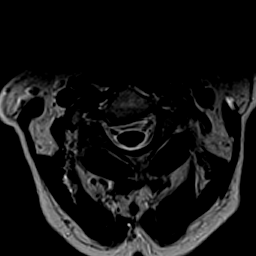

[27 of 48 positions shown; findings below may reference images not displayed]

FINDINGS: The patient is status post C4-7 fusion. Vertebral body height is
maintained. There is no worrisome marrow lesion. Facet degenerative
disease results in 0.3 cm anterolisthesis C7 on T1 and 0.3 cm
anterolisthesis T2 on T3. Marked thickening of the transverse axial
ligament is unchanged. Posterior subluxation of the left lateral
mass of C1 on C2 is unchanged. Degenerative change is seen about the
left C1-2 articulation with some subchondral edema and cyst
formation. The craniocervical junction is normal. Cervical cord
signal is normal. Imaged paraspinous structures are unremarkable.

C2-3: Right worse than left facet degenerative disease and a shallow
disc bulge without central canal or foraminal stenosis, unchanged.

C3-4: There is partial autologous fusion of the disc interspace and
the facet joints are ankylosed, more so on the left. The central
canal and foramina are open. The appearance is unchanged.

C4-5: Interval discectomy and fusion. Central canal narrowing seen
on the prior study is markedly improved. The left facet joint
appears ankylosed. Mild left foraminal narrowing is noted. The right
foramen is open.

C5-6: Status post discectomy and fusion. The central canal and
foramina are widely patent.

C6-7: Interval discectomy infusion. The central canal and right
foramen are widely patent. Uncovertebral spurring results in mild to
moderate left foraminal narrowing.

C7-T1: Left worse than right facet degenerative disease identified.
The patient has a central disc protrusion which nearly effaces the
ventral thecal sac. The right foramen is widely patent. Moderate
left foraminal narrowing is noted.
IMPRESSION: Status post C4-5 and C6-7 fusion since the prior MRI. The appearance
of both levels is markedly improved. Uncovertebral spurring on the
left at C6-7 causes mild to moderate foraminal narrowing.

Partial autologous fusion of the disc interspace at C3-4 where the
facet joints are ankylosed. The central canal and foramina are
widely patent.

Advanced facet degenerative disease C7-T1 results in 0.3 cm
anterolisthesis. Moderate left foraminal narrowing is present at
this level.

Degenerative change about the left occipital condyle and left
lateral mass of C1.

## 2017-08-10 DIAGNOSIS — M542 Cervicalgia: Secondary | ICD-10-CM | POA: Diagnosis not present

## 2017-08-10 DIAGNOSIS — G894 Chronic pain syndrome: Secondary | ICD-10-CM | POA: Diagnosis not present

## 2017-08-10 DIAGNOSIS — M47892 Other spondylosis, cervical region: Secondary | ICD-10-CM | POA: Diagnosis not present

## 2017-08-11 DIAGNOSIS — M1812 Unilateral primary osteoarthritis of first carpometacarpal joint, left hand: Secondary | ICD-10-CM | POA: Diagnosis not present

## 2017-08-11 DIAGNOSIS — G5602 Carpal tunnel syndrome, left upper limb: Secondary | ICD-10-CM | POA: Diagnosis not present

## 2017-08-11 DIAGNOSIS — M720 Palmar fascial fibromatosis [Dupuytren]: Secondary | ICD-10-CM | POA: Diagnosis not present

## 2017-08-11 DIAGNOSIS — M19042 Primary osteoarthritis, left hand: Secondary | ICD-10-CM | POA: Diagnosis not present

## 2017-08-13 DIAGNOSIS — J029 Acute pharyngitis, unspecified: Secondary | ICD-10-CM | POA: Diagnosis not present

## 2017-08-17 DIAGNOSIS — R05 Cough: Secondary | ICD-10-CM | POA: Diagnosis not present

## 2017-08-17 DIAGNOSIS — J019 Acute sinusitis, unspecified: Secondary | ICD-10-CM | POA: Diagnosis not present

## 2017-08-17 DIAGNOSIS — J309 Allergic rhinitis, unspecified: Secondary | ICD-10-CM | POA: Diagnosis not present

## 2017-08-31 DIAGNOSIS — G894 Chronic pain syndrome: Secondary | ICD-10-CM | POA: Diagnosis not present

## 2017-08-31 DIAGNOSIS — M542 Cervicalgia: Secondary | ICD-10-CM | POA: Diagnosis not present

## 2017-08-31 DIAGNOSIS — M47892 Other spondylosis, cervical region: Secondary | ICD-10-CM | POA: Diagnosis not present

## 2017-10-05 DIAGNOSIS — M4322 Fusion of spine, cervical region: Secondary | ICD-10-CM | POA: Diagnosis not present

## 2017-10-05 DIAGNOSIS — M542 Cervicalgia: Secondary | ICD-10-CM | POA: Diagnosis not present

## 2017-10-05 DIAGNOSIS — G894 Chronic pain syndrome: Secondary | ICD-10-CM | POA: Diagnosis not present

## 2017-10-20 DIAGNOSIS — G301 Alzheimer's disease with late onset: Secondary | ICD-10-CM | POA: Diagnosis not present

## 2017-10-20 DIAGNOSIS — F0280 Dementia in other diseases classified elsewhere without behavioral disturbance: Secondary | ICD-10-CM | POA: Diagnosis not present

## 2017-10-22 DIAGNOSIS — F29 Unspecified psychosis not due to a substance or known physiological condition: Secondary | ICD-10-CM | POA: Diagnosis not present

## 2017-11-09 DIAGNOSIS — M4322 Fusion of spine, cervical region: Secondary | ICD-10-CM | POA: Diagnosis not present

## 2017-11-09 DIAGNOSIS — M542 Cervicalgia: Secondary | ICD-10-CM | POA: Diagnosis not present

## 2017-11-09 DIAGNOSIS — G894 Chronic pain syndrome: Secondary | ICD-10-CM | POA: Diagnosis not present

## 2017-12-07 DIAGNOSIS — M47892 Other spondylosis, cervical region: Secondary | ICD-10-CM | POA: Diagnosis not present

## 2017-12-07 DIAGNOSIS — G894 Chronic pain syndrome: Secondary | ICD-10-CM | POA: Diagnosis not present

## 2017-12-07 DIAGNOSIS — M791 Myalgia, unspecified site: Secondary | ICD-10-CM | POA: Diagnosis not present

## 2017-12-07 DIAGNOSIS — Z79899 Other long term (current) drug therapy: Secondary | ICD-10-CM | POA: Diagnosis not present

## 2017-12-07 DIAGNOSIS — M542 Cervicalgia: Secondary | ICD-10-CM | POA: Diagnosis not present

## 2017-12-07 DIAGNOSIS — Z79891 Long term (current) use of opiate analgesic: Secondary | ICD-10-CM | POA: Diagnosis not present

## 2017-12-30 ENCOUNTER — Encounter: Payer: Self-pay | Admitting: Emergency Medicine

## 2017-12-30 DIAGNOSIS — F419 Anxiety disorder, unspecified: Secondary | ICD-10-CM | POA: Insufficient documentation

## 2017-12-30 DIAGNOSIS — F29 Unspecified psychosis not due to a substance or known physiological condition: Secondary | ICD-10-CM

## 2018-01-04 DIAGNOSIS — M791 Myalgia, unspecified site: Secondary | ICD-10-CM | POA: Diagnosis not present

## 2018-01-04 DIAGNOSIS — G894 Chronic pain syndrome: Secondary | ICD-10-CM | POA: Diagnosis not present

## 2018-01-04 DIAGNOSIS — M542 Cervicalgia: Secondary | ICD-10-CM | POA: Diagnosis not present

## 2018-01-04 DIAGNOSIS — M47892 Other spondylosis, cervical region: Secondary | ICD-10-CM | POA: Diagnosis not present

## 2018-01-10 ENCOUNTER — Encounter: Payer: Self-pay | Admitting: Psychiatry

## 2018-01-10 ENCOUNTER — Ambulatory Visit (INDEPENDENT_AMBULATORY_CARE_PROVIDER_SITE_OTHER): Payer: Medicare Other | Admitting: Psychiatry

## 2018-01-10 VITALS — BP 96/53 | HR 97 | Ht 66.0 in | Wt 174.0 lb

## 2018-01-10 DIAGNOSIS — F23 Brief psychotic disorder: Secondary | ICD-10-CM | POA: Diagnosis not present

## 2018-01-10 DIAGNOSIS — F419 Anxiety disorder, unspecified: Secondary | ICD-10-CM | POA: Diagnosis not present

## 2018-01-10 MED ORDER — FLUVOXAMINE MALEATE 100 MG PO TABS: 50.0000 mg | ORAL_TABLET | Freq: Two times a day (BID) | ORAL | 0 refills | Status: DC

## 2018-01-10 MED ORDER — OLANZAPINE 5 MG PO TABS: 5.0000 mg | ORAL_TABLET | Freq: Every day | ORAL | 0 refills | Status: DC

## 2018-01-10 NOTE — Progress Notes (Signed)
Colleen [name] [id number] 04-03-1934 83 y.o.  Subjective:   Patient ID:  Colleen [name] is a 83 y.o. (DOB February 16, 1935) female.  Chief Complaint:  Chief Complaint  Patient presents with  . Follow-up    h/o anxiety and delusions    HPI Colleen [name] presents to the office today for follow-up of depression and h/o psychosis. Olanzapine was decreased at last visit from 7.5 mg to 5 mg po QHS. She denies any positive or negative effects with decrease in dose.   She reports that she feels tired freqently. She reports that her mood has been "good" and denies depressed mood. She reports some anxiety at night and occasionally has trouble "turning my brain off." Reports about once a week or less she cannot fall asleep for a few hours. Denies any worsening in sleep initiation since dose decrease. Reports that she averages about 6 hours of sleep a night. Denies any decrease in appetite. Reports that motivation is ok but energy is low. Reports minimal difficulty with concentration. Denies any recent delusions or paranoia. Denies SI.   Granddaughter who is 8 yo has been in the hospital for over a month and pt reports that it was related to vaping.   She reports that she has not seen PCP or had labs drawn recently and has apt to see PCP next month and will have labs drawn at that time.   Past Medication Trials:  Gabapentin Trazodone Zyprexa Ativan  Namenda  Review of Systems:  Review of Systems  Gastrointestinal: Negative for constipation and diarrhea.  Musculoskeletal: Positive for back pain. Negative for gait problem.  Neurological: Negative for tremors.  Psychiatric/Behavioral:       Please refer to HPI    Medications: I have reviewed the patient's current medications.  Current Outpatient Medications  Medication Sig Dispense Refill  . BENICAR 20 MG tablet Take 1 tablet by mouth daily.    . bisacodyl (DULCOLAX) 5 MG EC tablet Take 1 tablet (5 mg total) by mouth daily as needed for moderate  constipation. 30 tablet 0  . cetirizine (ZYRTEC) 10 MG tablet Take 10 mg by mouth daily.    . cyclobenzaprine (FLEXERIL) 10 MG tablet Take 10 mg by mouth 3 (three) times daily as needed for muscle spasms.    Marland Kitchen guaiFENesin (MUCINEX) 600 MG 12 hr tablet Take 600 mg by mouth as needed (1 tablet).    Marland Kitchen HYDROcodone-acetaminophen (NORCO) 10-325 MG tablet Take 1 tablet by mouth every 6 (six) hours as needed.  0  . levothyroxine (SYNTHROID, LEVOTHROID) 50 MCG tablet Take 1 tablet by mouth daily.  12  . memantine (NAMENDA) 10 MG tablet   1  . OLANZapine (ZYPREXA) 5 MG tablet Take 1 tablet (5 mg total) by mouth at bedtime. 90 tablet 0  . omeprazole (PRILOSEC) 40 MG capsule TAKE 1 CAPSULE BY MOUTH ONCE DAILY BEFORE MEAL(S)  1  . Potassium Chloride ER 20 MEQ TBCR TK 1 T PO BID    . traZODone (DESYREL) 100 MG tablet Take 2 tablets by mouth at bedtime.    . Vitamin D, Ergocalciferol, (DRISDOL) 50000 units CAPS capsule Take 50,000 Units by mouth every 7 (seven) days.    . fluvoxaMINE (LUVOX) 100 MG tablet Take 0.5 tablets (50 mg total) by mouth 2 (two) times daily. 90 tablet 0  . gabapentin (NEURONTIN) 300 MG capsule Take 1 capsule by mouth at bedtime.    Marland Kitchen LORazepam (ATIVAN) 0.5 MG tablet Take 0.5 mg by mouth every 6 (six) hours  as needed for anxiety.    . ondansetron (ZOFRAN) 4 MG tablet Take 1 tablet (4 mg total) by mouth every 8 (eight) hours as needed for nausea or vomiting. (Patient not taking: Reported on 01/10/2018) 40 tablet 0  . oxyCODONE-acetaminophen (ROXICET) 5-325 MG tablet Take 1-2 tablets by mouth every 4 (four) hours as needed. (Patient not taking: Reported on 01/10/2018) 60 tablet 0   No current facility-administered medications for this visit.     Medication Side Effects: None and Other: Possible weight gain. Wt gain has continued despite dose reduction in Olanzapine  Allergies:  Allergies  Allergen Reactions  . Anti-Inflammatory Enzyme [Nutritional Supplements] Hives  . Celebrex  [Celecoxib]   . Feldene [Piroxicam]   . Latex Rash  . Penicillins Rash    Received 2 GM Ancef with no obvious reaction    Past Medical History:  Diagnosis Date  . Arthritis   . Cancer (HCC)    rt breast  . Chronic back pain   . Headache   . Heart murmur   . Hypertension   . Hypothyroidism   . Mitral valve disorder   . Osteoporosis   . Ulnar shaft fracture    right  . Vitamin D deficiency     Family History  Problem Relation Age of Onset  . Cancer Father   . Depression Sister   . Anxiety disorder Sister   . Cancer Sister   . Cancer Brother     Social History   Socioeconomic History  . Marital status: Married    Spouse name: Not on file  . Number of children: Not on file  . Years of education: Not on file  . Highest education level: Not on file  Occupational History  . Not on file  Social Needs  . Financial resource strain: Not on file  . Food insecurity:    Worry: Not on file    Inability: Not on file  . Transportation needs:    Medical: Not on file    Non-medical: Not on file  Tobacco Use  . Smoking status: Never Smoker  . Smokeless tobacco: Never Used  Substance and Sexual Activity  . Alcohol use: No  . Drug use: No  . Sexual activity: Not on file  Lifestyle  . Physical activity:    Days per week: Not on file    Minutes per session: Not on file  . Stress: Not on file  Relationships  . Social connections:    Talks on phone: Not on file    Gets together: Not on file    Attends religious service: Not on file    Active member of club or organization: Not on file    Attends meetings of clubs or organizations: Not on file    Relationship status: Not on file  . Intimate partner violence:    Fear of current or ex partner: Not on file    Emotionally abused: Not on file    Physically abused: Not on file    Forced sexual activity: Not on file  Other Topics Concern  . Not on file  Social History Narrative  . Not on file    Past Medical History,  Surgical history, Social history, and Family history were reviewed and updated as appropriate.   Please see review of systems for further details on the patient's review from today.   Objective:   Physical Exam:  BP (!) 96/53   Pulse 97   Ht 5\' 6"  (1.676 m)  Wt 174 lb (78.9 kg)   BMI 28.08 kg/m   Physical Exam  Constitutional: She is oriented to person, place, and time. She appears well-developed. No distress.  Musculoskeletal: She exhibits no deformity.  Neurological: She is alert and oriented to person, place, and time. Coordination normal.  Psychiatric: She has a normal mood and affect. Her speech is normal and behavior is normal. Judgment and thought content normal. Her mood appears not anxious. Her affect is not angry, not blunt, not labile and not inappropriate. Cognition and memory are normal. She does not exhibit a depressed mood. She expresses no homicidal and no suicidal ideation. She expresses no suicidal plans and no homicidal plans.  Neatly dressed and groomed.  Insight intact. No auditory or visual hallucinations. No delusions.     Lab Review:     Component Value Date/Time   NA 137 01/25/2015 0440   K 3.7 01/25/2015 0440   CL 100 (L) 01/25/2015 0440   CO2 28 01/25/2015 0440   GLUCOSE 123 (H) 01/25/2015 0440   BUN 7 01/25/2015 0440   CREATININE 0.56 01/25/2015 0440   CALCIUM 8.4 (L) 01/25/2015 0440   PROT 6.2 (L) 01/10/2015 1059   ALBUMIN 3.8 01/10/2015 1059   AST 25 01/10/2015 1059   ALT 21 01/10/2015 1059   ALKPHOS 69 01/10/2015 1059   BILITOT 0.5 01/10/2015 1059   GFRNONAA >60 01/25/2015 0440   GFRAA >60 01/25/2015 0440       Component Value Date/Time   WBC 9.7 01/25/2015 0440   RBC 3.36 (L) 01/25/2015 0440   HGB 9.8 (L) 01/25/2015 0440   HCT 29.6 (L) 01/25/2015 0440   PLT 224 01/25/2015 0440   MCV 88.1 01/25/2015 0440   MCH 29.2 01/25/2015 0440   MCHC 33.1 01/25/2015 0440   RDW 13.6 01/25/2015 0440   LYMPHSABS 1.7 01/10/2015 1059   MONOABS  0.7 01/10/2015 1059   EOSABS 0.1 01/10/2015 1059   BASOSABS 0.0 01/10/2015 1059    No results found for: POCLITH, LITHIUM   No results found for: PHENYTOIN, PHENOBARB, VALPROATE, CBMZ   .res Assessment: Plan:   Pt seen for 30 minutes and greater than 50% of visit spent counseling patient regarding treatment options.  Discussed continuing olanzapine 5 mg p.o. nightly since there have been no worsening signs and symptoms noted or reported with dose reduction at last visit from 7.5 mg at bedtime.  Discussed contacting office with any recurrence of target signs and symptoms.  Discussed considering possible further dose reduction at next visit if no worsening in signs and symptoms at that time.  Patient reports having an appointment next month with PCP to obtain labs to include lipid panel.  Will review labs at next appointment to assess for any potential metabolic side effects with olanzapine.  Will continue olanzapine 5 mg p.o. nightly due to history of psychosis that precipitated psychiatric hospital admission.  Will continue Luvox 100 mg 1/2 tablet twice daily for anxiety.  Patient reports that she would prefer to continue taking one half tab twice daily instead of changing to 50 mg tablet twice daily.  Patient to follow-up in 5 months or sooner if clinically indicated.  Brief psychotic disorder ([place]) - Plan: OLANZapine (ZYPREXA) 5 MG tablet  Anxiety disorder, unspecified type - Plan: fluvoxaMINE (LUVOX) 100 MG tablet  Please see After Visit Summary for patient specific instructions.  No future appointments.  No orders of the defined types were placed in this encounter.     -------------------------------

## 2018-01-19 DIAGNOSIS — Z1231 Encounter for screening mammogram for malignant neoplasm of breast: Secondary | ICD-10-CM | POA: Diagnosis not present

## 2018-01-24 DIAGNOSIS — Z23 Encounter for immunization: Secondary | ICD-10-CM | POA: Diagnosis not present

## 2018-01-24 DIAGNOSIS — Z9011 Acquired absence of right breast and nipple: Secondary | ICD-10-CM

## 2018-01-24 DIAGNOSIS — Z0000 Encounter for general adult medical examination without abnormal findings: Secondary | ICD-10-CM | POA: Diagnosis not present

## 2018-01-24 DIAGNOSIS — F3341 Major depressive disorder, recurrent, in partial remission: Secondary | ICD-10-CM | POA: Diagnosis not present

## 2018-01-24 DIAGNOSIS — I10 Essential (primary) hypertension: Secondary | ICD-10-CM | POA: Diagnosis not present

## 2018-01-24 DIAGNOSIS — E876 Hypokalemia: Secondary | ICD-10-CM | POA: Diagnosis not present

## 2018-01-24 DIAGNOSIS — Z79899 Other long term (current) drug therapy: Secondary | ICD-10-CM | POA: Diagnosis not present

## 2018-01-24 DIAGNOSIS — Z853 Personal history of malignant neoplasm of breast: Secondary | ICD-10-CM

## 2018-01-24 DIAGNOSIS — E039 Hypothyroidism, unspecified: Secondary | ICD-10-CM | POA: Diagnosis not present

## 2018-01-24 DIAGNOSIS — M8580 Other specified disorders of bone density and structure, unspecified site: Secondary | ICD-10-CM | POA: Diagnosis not present

## 2018-01-24 DIAGNOSIS — E782 Mixed hyperlipidemia: Secondary | ICD-10-CM | POA: Diagnosis not present

## 2018-01-31 DIAGNOSIS — H52223 Regular astigmatism, bilateral: Secondary | ICD-10-CM | POA: Diagnosis not present

## 2018-01-31 DIAGNOSIS — Z961 Presence of intraocular lens: Secondary | ICD-10-CM | POA: Diagnosis not present

## 2018-01-31 DIAGNOSIS — H26492 Other secondary cataract, left eye: Secondary | ICD-10-CM | POA: Diagnosis not present

## 2018-01-31 DIAGNOSIS — H5203 Hypermetropia, bilateral: Secondary | ICD-10-CM | POA: Diagnosis not present

## 2018-01-31 DIAGNOSIS — H524 Presbyopia: Secondary | ICD-10-CM | POA: Diagnosis not present

## 2018-01-31 DIAGNOSIS — H40013 Open angle with borderline findings, low risk, bilateral: Secondary | ICD-10-CM | POA: Diagnosis not present

## 2018-01-31 DIAGNOSIS — H47233 Glaucomatous optic atrophy, bilateral: Secondary | ICD-10-CM | POA: Diagnosis not present

## 2018-01-31 DIAGNOSIS — Z9841 Cataract extraction status, right eye: Secondary | ICD-10-CM | POA: Diagnosis not present

## 2018-02-08 DIAGNOSIS — M47892 Other spondylosis, cervical region: Secondary | ICD-10-CM | POA: Diagnosis not present

## 2018-02-08 DIAGNOSIS — M791 Myalgia, unspecified site: Secondary | ICD-10-CM | POA: Diagnosis not present

## 2018-02-08 DIAGNOSIS — G894 Chronic pain syndrome: Secondary | ICD-10-CM | POA: Diagnosis not present

## 2018-02-08 DIAGNOSIS — M47896 Other spondylosis, lumbar region: Secondary | ICD-10-CM | POA: Diagnosis not present

## 2018-02-24 ENCOUNTER — Other Ambulatory Visit: Payer: Self-pay | Admitting: Orthopaedic Surgery

## 2018-02-24 DIAGNOSIS — M791 Myalgia, unspecified site: Secondary | ICD-10-CM

## 2018-02-28 ENCOUNTER — Ambulatory Visit
Admission: RE | Admit: 2018-02-28 | Discharge: 2018-02-28 | Disposition: A | Discharge status: Home / Self Care | Payer: Medicare Other | Source: Ambulatory Visit | Attending: Orthopaedic Surgery | Admitting: Orthopaedic Surgery

## 2018-02-28 DIAGNOSIS — M791 Myalgia, unspecified site: Secondary | ICD-10-CM

## 2018-02-28 DIAGNOSIS — M5126 Other intervertebral disc displacement, lumbar region: Secondary | ICD-10-CM | POA: Diagnosis not present

## 2018-02-28 DIAGNOSIS — M47816 Spondylosis without myelopathy or radiculopathy, lumbar region: Secondary | ICD-10-CM | POA: Diagnosis not present

## 2018-02-28 DIAGNOSIS — M48061 Spinal stenosis, lumbar region without neurogenic claudication: Secondary | ICD-10-CM | POA: Diagnosis not present

## 2018-03-17 ENCOUNTER — Telehealth: Payer: Self-pay | Admitting: Psychiatry

## 2018-03-17 DIAGNOSIS — M5416 Radiculopathy, lumbar region: Secondary | ICD-10-CM | POA: Diagnosis not present

## 2018-03-17 DIAGNOSIS — M545 Low back pain: Secondary | ICD-10-CM | POA: Diagnosis not present

## 2018-03-17 DIAGNOSIS — G894 Chronic pain syndrome: Secondary | ICD-10-CM | POA: Diagnosis not present

## 2018-03-17 DIAGNOSIS — F419 Anxiety disorder, unspecified: Secondary | ICD-10-CM

## 2018-03-17 NOTE — Telephone Encounter (Signed)
Last fill was 01/26/2017  Has been seen in epic

## 2018-03-17 NOTE — Telephone Encounter (Signed)
Pt. Called and said that she needs a refill on lorazapam 0.5mg  escribed to the [facility] on [address] In [place] [address] [phone]

## 2018-03-18 MED ORDER — LORAZEPAM 0.5 MG PO TABS: 0.5000 mg | ORAL_TABLET | Freq: Four times a day (QID) | ORAL | 0 refills | Status: AC | PRN

## 2018-03-18 NOTE — Telephone Encounter (Signed)
rx sent

## 2018-03-21 ENCOUNTER — Telehealth: Payer: Self-pay | Admitting: Psychiatry

## 2018-03-21 NOTE — Telephone Encounter (Signed)
Provider has written for this rx, already, last order  escribed on 01/10/2018 #90. Will contact daughter to clarify message.

## 2018-03-21 NOTE — Telephone Encounter (Signed)
Called number but it's a business, the daughter's name wasn't left so I didn't know who to ask for.   Will follow up tomorrow

## 2018-03-21 NOTE — Telephone Encounter (Signed)
NEED REFILL ON OLANZAPINE 5 MG. TAB 1X A DAY, STATED PCP HAS RETIRED AND PT CANNOT GET SCRIPT FROM THEM ANYMORE PT HAS 5 MORE PILLS LEFT. [place] DAUGHTER [phone]

## 2018-03-22 DIAGNOSIS — M5416 Radiculopathy, lumbar region: Secondary | ICD-10-CM | POA: Diagnosis not present

## 2018-03-30 DIAGNOSIS — Z6827 Body mass index (BMI) 27.0-27.9, adult: Secondary | ICD-10-CM | POA: Diagnosis not present

## 2018-03-30 DIAGNOSIS — I10 Essential (primary) hypertension: Secondary | ICD-10-CM | POA: Diagnosis not present

## 2018-03-30 DIAGNOSIS — B372 Candidiasis of skin and nail: Secondary | ICD-10-CM | POA: Diagnosis not present

## 2018-03-30 DIAGNOSIS — N3001 Acute cystitis with hematuria: Secondary | ICD-10-CM | POA: Diagnosis not present

## 2018-04-12 ENCOUNTER — Other Ambulatory Visit: Payer: Self-pay

## 2018-04-12 DIAGNOSIS — F419 Anxiety disorder, unspecified: Secondary | ICD-10-CM

## 2018-04-12 MED ORDER — FLUVOXAMINE MALEATE 100 MG PO TABS: 50.0000 mg | ORAL_TABLET | Freq: Two times a day (BID) | ORAL | 0 refills | Status: DC

## 2018-04-12 NOTE — Telephone Encounter (Signed)
Refill request received from [facility] For fluvoxamine 100mg  1/2 tab twice daily #90 Last OV 01/10/2018 Next ov 06/10/2018

## 2018-04-13 DIAGNOSIS — M5416 Radiculopathy, lumbar region: Secondary | ICD-10-CM | POA: Diagnosis not present

## 2018-04-13 DIAGNOSIS — M545 Low back pain: Secondary | ICD-10-CM | POA: Diagnosis not present

## 2018-04-13 DIAGNOSIS — G894 Chronic pain syndrome: Secondary | ICD-10-CM | POA: Diagnosis not present

## 2018-04-13 DIAGNOSIS — M542 Cervicalgia: Secondary | ICD-10-CM | POA: Diagnosis not present

## 2018-05-04 DIAGNOSIS — R300 Dysuria: Secondary | ICD-10-CM | POA: Diagnosis not present

## 2018-05-04 DIAGNOSIS — N3090 Cystitis, unspecified without hematuria: Secondary | ICD-10-CM | POA: Diagnosis not present

## 2018-05-11 DIAGNOSIS — M545 Low back pain: Secondary | ICD-10-CM | POA: Diagnosis not present

## 2018-05-11 DIAGNOSIS — G894 Chronic pain syndrome: Secondary | ICD-10-CM | POA: Diagnosis not present

## 2018-05-11 DIAGNOSIS — M5416 Radiculopathy, lumbar region: Secondary | ICD-10-CM | POA: Diagnosis not present

## 2018-06-07 DIAGNOSIS — F09 Unspecified mental disorder due to known physiological condition: Secondary | ICD-10-CM | POA: Diagnosis not present

## 2018-06-07 DIAGNOSIS — G894 Chronic pain syndrome: Secondary | ICD-10-CM | POA: Diagnosis not present

## 2018-06-07 DIAGNOSIS — M961 Postlaminectomy syndrome, not elsewhere classified: Secondary | ICD-10-CM | POA: Diagnosis not present

## 2018-06-07 DIAGNOSIS — F0789 Other personality and behavioral disorders due to known physiological condition: Secondary | ICD-10-CM | POA: Diagnosis not present

## 2018-06-08 DIAGNOSIS — Z6827 Body mass index (BMI) 27.0-27.9, adult: Secondary | ICD-10-CM | POA: Diagnosis not present

## 2018-06-08 DIAGNOSIS — M5416 Radiculopathy, lumbar region: Secondary | ICD-10-CM | POA: Diagnosis not present

## 2018-06-08 DIAGNOSIS — G894 Chronic pain syndrome: Secondary | ICD-10-CM | POA: Diagnosis not present

## 2018-06-08 DIAGNOSIS — M545 Low back pain: Secondary | ICD-10-CM | POA: Diagnosis not present

## 2018-06-10 ENCOUNTER — Ambulatory Visit: Payer: Medicare Other | Admitting: Psychiatry

## 2018-06-21 ENCOUNTER — Other Ambulatory Visit: Payer: Self-pay

## 2018-06-21 DIAGNOSIS — F23 Brief psychotic disorder: Secondary | ICD-10-CM

## 2018-06-21 MED ORDER — OLANZAPINE 5 MG PO TABS: 5.0000 mg | ORAL_TABLET | Freq: Every day | ORAL | 0 refills | Status: DC

## 2018-06-27 DIAGNOSIS — S62331A Displaced fracture of neck of second metacarpal bone, left hand, initial encounter for closed fracture: Secondary | ICD-10-CM | POA: Diagnosis not present

## 2018-06-27 DIAGNOSIS — S52531A Colles' fracture of right radius, initial encounter for closed fracture: Secondary | ICD-10-CM | POA: Diagnosis not present

## 2018-06-27 DIAGNOSIS — S0181XA Laceration without foreign body of other part of head, initial encounter: Secondary | ICD-10-CM | POA: Diagnosis not present

## 2018-06-27 DIAGNOSIS — S62327A Displaced fracture of shaft of fifth metacarpal bone, left hand, initial encounter for closed fracture: Secondary | ICD-10-CM | POA: Diagnosis not present

## 2018-06-29 ENCOUNTER — Ambulatory Visit: Payer: Medicare Other | Admitting: Psychiatry

## 2018-07-04 DIAGNOSIS — S62327A Displaced fracture of shaft of fifth metacarpal bone, left hand, initial encounter for closed fracture: Secondary | ICD-10-CM | POA: Diagnosis not present

## 2018-07-04 DIAGNOSIS — S52531A Colles' fracture of right radius, initial encounter for closed fracture: Secondary | ICD-10-CM | POA: Diagnosis not present

## 2018-07-06 DIAGNOSIS — G894 Chronic pain syndrome: Secondary | ICD-10-CM | POA: Diagnosis not present

## 2018-07-06 DIAGNOSIS — M545 Low back pain: Secondary | ICD-10-CM | POA: Diagnosis not present

## 2018-07-06 DIAGNOSIS — M542 Cervicalgia: Secondary | ICD-10-CM | POA: Diagnosis not present

## 2018-07-09 ENCOUNTER — Other Ambulatory Visit: Payer: Self-pay | Admitting: Psychiatry

## 2018-07-09 DIAGNOSIS — F419 Anxiety disorder, unspecified: Secondary | ICD-10-CM

## 2018-07-11 DIAGNOSIS — S62331D Displaced fracture of neck of second metacarpal bone, left hand, subsequent encounter for fracture with routine healing: Secondary | ICD-10-CM | POA: Diagnosis not present

## 2018-07-11 DIAGNOSIS — S52531D Colles' fracture of right radius, subsequent encounter for closed fracture with routine healing: Secondary | ICD-10-CM | POA: Diagnosis not present

## 2018-07-11 DIAGNOSIS — S62327D Displaced fracture of shaft of fifth metacarpal bone, left hand, subsequent encounter for fracture with routine healing: Secondary | ICD-10-CM | POA: Diagnosis not present

## 2018-08-10 DIAGNOSIS — S62327D Displaced fracture of shaft of fifth metacarpal bone, left hand, subsequent encounter for fracture with routine healing: Secondary | ICD-10-CM | POA: Diagnosis not present

## 2018-08-10 DIAGNOSIS — S52531D Colles' fracture of right radius, subsequent encounter for closed fracture with routine healing: Secondary | ICD-10-CM | POA: Diagnosis not present

## 2018-08-10 DIAGNOSIS — S62331D Displaced fracture of neck of second metacarpal bone, left hand, subsequent encounter for fracture with routine healing: Secondary | ICD-10-CM | POA: Diagnosis not present

## 2018-08-11 DIAGNOSIS — G894 Chronic pain syndrome: Secondary | ICD-10-CM | POA: Diagnosis not present

## 2018-08-11 DIAGNOSIS — M542 Cervicalgia: Secondary | ICD-10-CM | POA: Diagnosis not present

## 2018-08-11 DIAGNOSIS — Z79899 Other long term (current) drug therapy: Secondary | ICD-10-CM | POA: Diagnosis not present

## 2018-08-11 DIAGNOSIS — M545 Low back pain: Secondary | ICD-10-CM | POA: Diagnosis not present

## 2018-08-11 DIAGNOSIS — Z79891 Long term (current) use of opiate analgesic: Secondary | ICD-10-CM | POA: Diagnosis not present

## 2018-08-17 DIAGNOSIS — E039 Hypothyroidism, unspecified: Secondary | ICD-10-CM | POA: Diagnosis not present

## 2018-08-17 DIAGNOSIS — R609 Edema, unspecified: Secondary | ICD-10-CM | POA: Diagnosis not present

## 2018-08-17 DIAGNOSIS — E876 Hypokalemia: Secondary | ICD-10-CM | POA: Diagnosis not present

## 2018-08-17 DIAGNOSIS — Z6826 Body mass index (BMI) 26.0-26.9, adult: Secondary | ICD-10-CM | POA: Diagnosis not present

## 2018-08-17 DIAGNOSIS — Z79899 Other long term (current) drug therapy: Secondary | ICD-10-CM | POA: Diagnosis not present

## 2018-08-25 DIAGNOSIS — R609 Edema, unspecified: Secondary | ICD-10-CM | POA: Diagnosis not present

## 2018-08-25 DIAGNOSIS — L304 Erythema intertrigo: Secondary | ICD-10-CM | POA: Diagnosis not present

## 2018-08-25 DIAGNOSIS — E876 Hypokalemia: Secondary | ICD-10-CM | POA: Diagnosis not present

## 2018-08-25 DIAGNOSIS — Z6826 Body mass index (BMI) 26.0-26.9, adult: Secondary | ICD-10-CM | POA: Diagnosis not present

## 2018-09-05 DIAGNOSIS — S63331D Traumatic rupture of right ulnocarpal (palmar) ligament, subsequent encounter: Secondary | ICD-10-CM | POA: Diagnosis not present

## 2018-09-05 DIAGNOSIS — S52531D Colles' fracture of right radius, subsequent encounter for closed fracture with routine healing: Secondary | ICD-10-CM | POA: Diagnosis not present

## 2018-09-05 DIAGNOSIS — S62327D Displaced fracture of shaft of fifth metacarpal bone, left hand, subsequent encounter for fracture with routine healing: Secondary | ICD-10-CM | POA: Diagnosis not present

## 2018-09-08 DIAGNOSIS — R609 Edema, unspecified: Secondary | ICD-10-CM | POA: Diagnosis not present

## 2018-09-08 DIAGNOSIS — E876 Hypokalemia: Secondary | ICD-10-CM | POA: Diagnosis not present

## 2018-09-08 DIAGNOSIS — Z6826 Body mass index (BMI) 26.0-26.9, adult: Secondary | ICD-10-CM | POA: Diagnosis not present

## 2018-09-08 DIAGNOSIS — R739 Hyperglycemia, unspecified: Secondary | ICD-10-CM | POA: Diagnosis not present

## 2018-09-12 DIAGNOSIS — G894 Chronic pain syndrome: Secondary | ICD-10-CM | POA: Diagnosis not present

## 2018-09-12 DIAGNOSIS — M545 Low back pain: Secondary | ICD-10-CM | POA: Diagnosis not present

## 2018-09-12 DIAGNOSIS — M542 Cervicalgia: Secondary | ICD-10-CM | POA: Diagnosis not present

## 2018-09-13 DIAGNOSIS — M79642 Pain in left hand: Secondary | ICD-10-CM | POA: Diagnosis not present

## 2018-09-13 DIAGNOSIS — M25531 Pain in right wrist: Secondary | ICD-10-CM | POA: Diagnosis not present

## 2018-09-13 DIAGNOSIS — M6281 Muscle weakness (generalized): Secondary | ICD-10-CM | POA: Diagnosis not present

## 2018-09-13 DIAGNOSIS — M2560 Stiffness of unspecified joint, not elsewhere classified: Secondary | ICD-10-CM | POA: Diagnosis not present

## 2018-09-14 DIAGNOSIS — M6281 Muscle weakness (generalized): Secondary | ICD-10-CM | POA: Diagnosis not present

## 2018-09-14 DIAGNOSIS — M79642 Pain in left hand: Secondary | ICD-10-CM | POA: Diagnosis not present

## 2018-09-14 DIAGNOSIS — M25531 Pain in right wrist: Secondary | ICD-10-CM | POA: Diagnosis not present

## 2018-09-14 DIAGNOSIS — M2560 Stiffness of unspecified joint, not elsewhere classified: Secondary | ICD-10-CM | POA: Diagnosis not present

## 2018-09-16 ENCOUNTER — Other Ambulatory Visit: Payer: Self-pay | Admitting: Psychiatry

## 2018-09-16 DIAGNOSIS — F23 Brief psychotic disorder: Secondary | ICD-10-CM

## 2018-09-19 DIAGNOSIS — M79642 Pain in left hand: Secondary | ICD-10-CM | POA: Diagnosis not present

## 2018-09-19 DIAGNOSIS — M2560 Stiffness of unspecified joint, not elsewhere classified: Secondary | ICD-10-CM | POA: Diagnosis not present

## 2018-09-19 DIAGNOSIS — M6281 Muscle weakness (generalized): Secondary | ICD-10-CM | POA: Diagnosis not present

## 2018-09-19 DIAGNOSIS — M25531 Pain in right wrist: Secondary | ICD-10-CM | POA: Diagnosis not present

## 2018-09-21 DIAGNOSIS — M79642 Pain in left hand: Secondary | ICD-10-CM | POA: Diagnosis not present

## 2018-09-21 DIAGNOSIS — M6281 Muscle weakness (generalized): Secondary | ICD-10-CM | POA: Diagnosis not present

## 2018-09-21 DIAGNOSIS — M2560 Stiffness of unspecified joint, not elsewhere classified: Secondary | ICD-10-CM | POA: Diagnosis not present

## 2018-09-21 DIAGNOSIS — M25531 Pain in right wrist: Secondary | ICD-10-CM | POA: Diagnosis not present

## 2018-09-26 DIAGNOSIS — M79642 Pain in left hand: Secondary | ICD-10-CM | POA: Diagnosis not present

## 2018-09-26 DIAGNOSIS — M2560 Stiffness of unspecified joint, not elsewhere classified: Secondary | ICD-10-CM | POA: Diagnosis not present

## 2018-09-26 DIAGNOSIS — M25531 Pain in right wrist: Secondary | ICD-10-CM | POA: Diagnosis not present

## 2018-09-26 DIAGNOSIS — M6281 Muscle weakness (generalized): Secondary | ICD-10-CM | POA: Diagnosis not present

## 2018-09-29 DIAGNOSIS — M25531 Pain in right wrist: Secondary | ICD-10-CM | POA: Diagnosis not present

## 2018-09-29 DIAGNOSIS — I10 Essential (primary) hypertension: Secondary | ICD-10-CM | POA: Diagnosis not present

## 2018-09-29 DIAGNOSIS — M79642 Pain in left hand: Secondary | ICD-10-CM | POA: Diagnosis not present

## 2018-09-29 DIAGNOSIS — M2560 Stiffness of unspecified joint, not elsewhere classified: Secondary | ICD-10-CM | POA: Diagnosis not present

## 2018-09-29 DIAGNOSIS — M6281 Muscle weakness (generalized): Secondary | ICD-10-CM | POA: Diagnosis not present

## 2018-10-04 DIAGNOSIS — M2560 Stiffness of unspecified joint, not elsewhere classified: Secondary | ICD-10-CM | POA: Diagnosis not present

## 2018-10-04 DIAGNOSIS — M6281 Muscle weakness (generalized): Secondary | ICD-10-CM | POA: Diagnosis not present

## 2018-10-04 DIAGNOSIS — M79642 Pain in left hand: Secondary | ICD-10-CM | POA: Diagnosis not present

## 2018-10-04 DIAGNOSIS — M25531 Pain in right wrist: Secondary | ICD-10-CM | POA: Diagnosis not present

## 2018-10-04 DIAGNOSIS — I10 Essential (primary) hypertension: Secondary | ICD-10-CM | POA: Diagnosis not present

## 2018-10-06 DIAGNOSIS — M79642 Pain in left hand: Secondary | ICD-10-CM | POA: Diagnosis not present

## 2018-10-06 DIAGNOSIS — M2560 Stiffness of unspecified joint, not elsewhere classified: Secondary | ICD-10-CM | POA: Diagnosis not present

## 2018-10-06 DIAGNOSIS — M25531 Pain in right wrist: Secondary | ICD-10-CM | POA: Diagnosis not present

## 2018-10-06 DIAGNOSIS — M6281 Muscle weakness (generalized): Secondary | ICD-10-CM | POA: Diagnosis not present

## 2018-10-10 DIAGNOSIS — G894 Chronic pain syndrome: Secondary | ICD-10-CM | POA: Diagnosis not present

## 2018-10-10 DIAGNOSIS — M545 Low back pain: Secondary | ICD-10-CM | POA: Diagnosis not present

## 2018-10-10 DIAGNOSIS — M542 Cervicalgia: Secondary | ICD-10-CM | POA: Diagnosis not present

## 2018-10-11 DIAGNOSIS — M6281 Muscle weakness (generalized): Secondary | ICD-10-CM | POA: Diagnosis not present

## 2018-10-11 DIAGNOSIS — M2560 Stiffness of unspecified joint, not elsewhere classified: Secondary | ICD-10-CM | POA: Diagnosis not present

## 2018-10-11 DIAGNOSIS — M25531 Pain in right wrist: Secondary | ICD-10-CM | POA: Diagnosis not present

## 2018-10-11 DIAGNOSIS — M79642 Pain in left hand: Secondary | ICD-10-CM | POA: Diagnosis not present

## 2018-10-13 DIAGNOSIS — M79642 Pain in left hand: Secondary | ICD-10-CM | POA: Diagnosis not present

## 2018-10-13 DIAGNOSIS — M25531 Pain in right wrist: Secondary | ICD-10-CM | POA: Diagnosis not present

## 2018-10-13 DIAGNOSIS — M2560 Stiffness of unspecified joint, not elsewhere classified: Secondary | ICD-10-CM | POA: Diagnosis not present

## 2018-10-13 DIAGNOSIS — M6281 Muscle weakness (generalized): Secondary | ICD-10-CM | POA: Diagnosis not present

## 2018-10-14 DIAGNOSIS — S62327D Displaced fracture of shaft of fifth metacarpal bone, left hand, subsequent encounter for fracture with routine healing: Secondary | ICD-10-CM | POA: Diagnosis not present

## 2018-10-14 DIAGNOSIS — S62331D Displaced fracture of neck of second metacarpal bone, left hand, subsequent encounter for fracture with routine healing: Secondary | ICD-10-CM | POA: Diagnosis not present

## 2018-10-14 DIAGNOSIS — S52531D Colles' fracture of right radius, subsequent encounter for closed fracture with routine healing: Secondary | ICD-10-CM | POA: Diagnosis not present

## 2018-10-17 ENCOUNTER — Other Ambulatory Visit: Payer: Self-pay | Admitting: Psychiatry

## 2018-10-17 DIAGNOSIS — F419 Anxiety disorder, unspecified: Secondary | ICD-10-CM

## 2018-10-17 DIAGNOSIS — M79642 Pain in left hand: Secondary | ICD-10-CM | POA: Diagnosis not present

## 2018-10-17 DIAGNOSIS — M25531 Pain in right wrist: Secondary | ICD-10-CM | POA: Diagnosis not present

## 2018-10-17 DIAGNOSIS — M2560 Stiffness of unspecified joint, not elsewhere classified: Secondary | ICD-10-CM | POA: Diagnosis not present

## 2018-10-17 DIAGNOSIS — M6281 Muscle weakness (generalized): Secondary | ICD-10-CM | POA: Diagnosis not present

## 2018-10-17 NOTE — Telephone Encounter (Signed)
Has not been in since 12/2017

## 2018-10-19 DIAGNOSIS — M189 Osteoarthritis of first carpometacarpal joint, unspecified: Secondary | ICD-10-CM | POA: Diagnosis not present

## 2018-10-19 DIAGNOSIS — S63213A Subluxation of metacarpophalangeal joint of left middle finger, initial encounter: Secondary | ICD-10-CM | POA: Diagnosis not present

## 2018-10-19 DIAGNOSIS — M19042 Primary osteoarthritis, left hand: Secondary | ICD-10-CM | POA: Diagnosis not present

## 2018-10-19 DIAGNOSIS — M6281 Muscle weakness (generalized): Secondary | ICD-10-CM | POA: Diagnosis not present

## 2018-10-19 DIAGNOSIS — M25531 Pain in right wrist: Secondary | ICD-10-CM | POA: Diagnosis not present

## 2018-10-19 DIAGNOSIS — S62331D Displaced fracture of neck of second metacarpal bone, left hand, subsequent encounter for fracture with routine healing: Secondary | ICD-10-CM | POA: Diagnosis not present

## 2018-10-19 DIAGNOSIS — S62327D Displaced fracture of shaft of fifth metacarpal bone, left hand, subsequent encounter for fracture with routine healing: Secondary | ICD-10-CM | POA: Diagnosis not present

## 2018-10-19 DIAGNOSIS — M79642 Pain in left hand: Secondary | ICD-10-CM | POA: Diagnosis not present

## 2018-10-19 DIAGNOSIS — M2560 Stiffness of unspecified joint, not elsewhere classified: Secondary | ICD-10-CM | POA: Diagnosis not present

## 2018-10-24 DIAGNOSIS — M6281 Muscle weakness (generalized): Secondary | ICD-10-CM | POA: Diagnosis not present

## 2018-10-24 DIAGNOSIS — M20032 Swan-neck deformity of left finger(s): Secondary | ICD-10-CM | POA: Diagnosis not present

## 2018-10-24 DIAGNOSIS — S62327D Displaced fracture of shaft of fifth metacarpal bone, left hand, subsequent encounter for fracture with routine healing: Secondary | ICD-10-CM | POA: Diagnosis not present

## 2018-10-24 DIAGNOSIS — S63213S Subluxation of metacarpophalangeal joint of left middle finger, sequela: Secondary | ICD-10-CM | POA: Diagnosis not present

## 2018-10-24 DIAGNOSIS — M65342 Trigger finger, left ring finger: Secondary | ICD-10-CM | POA: Diagnosis not present

## 2018-10-24 DIAGNOSIS — M25531 Pain in right wrist: Secondary | ICD-10-CM | POA: Diagnosis not present

## 2018-10-24 DIAGNOSIS — M79642 Pain in left hand: Secondary | ICD-10-CM | POA: Diagnosis not present

## 2018-10-24 DIAGNOSIS — S62331D Displaced fracture of neck of second metacarpal bone, left hand, subsequent encounter for fracture with routine healing: Secondary | ICD-10-CM | POA: Diagnosis not present

## 2018-10-24 DIAGNOSIS — M2560 Stiffness of unspecified joint, not elsewhere classified: Secondary | ICD-10-CM | POA: Diagnosis not present

## 2018-11-08 DIAGNOSIS — G894 Chronic pain syndrome: Secondary | ICD-10-CM | POA: Diagnosis not present

## 2018-11-08 DIAGNOSIS — Z6827 Body mass index (BMI) 27.0-27.9, adult: Secondary | ICD-10-CM | POA: Diagnosis not present

## 2018-11-08 DIAGNOSIS — M545 Low back pain: Secondary | ICD-10-CM | POA: Diagnosis not present

## 2018-11-08 DIAGNOSIS — M542 Cervicalgia: Secondary | ICD-10-CM | POA: Diagnosis not present

## 2018-11-14 DIAGNOSIS — S62331D Displaced fracture of neck of second metacarpal bone, left hand, subsequent encounter for fracture with routine healing: Secondary | ICD-10-CM | POA: Diagnosis not present

## 2018-11-14 DIAGNOSIS — S62327D Displaced fracture of shaft of fifth metacarpal bone, left hand, subsequent encounter for fracture with routine healing: Secondary | ICD-10-CM | POA: Diagnosis not present

## 2018-11-24 DIAGNOSIS — S62327A Displaced fracture of shaft of fifth metacarpal bone, left hand, initial encounter for closed fracture: Secondary | ICD-10-CM | POA: Diagnosis not present

## 2018-11-24 DIAGNOSIS — M069 Rheumatoid arthritis, unspecified: Secondary | ICD-10-CM | POA: Diagnosis not present

## 2018-11-28 DIAGNOSIS — M542 Cervicalgia: Secondary | ICD-10-CM | POA: Diagnosis not present

## 2018-11-28 DIAGNOSIS — M545 Low back pain: Secondary | ICD-10-CM | POA: Diagnosis not present

## 2018-11-28 DIAGNOSIS — M47896 Other spondylosis, lumbar region: Secondary | ICD-10-CM | POA: Diagnosis not present

## 2018-11-28 DIAGNOSIS — M7918 Myalgia, other site: Secondary | ICD-10-CM | POA: Diagnosis not present

## 2018-11-28 DIAGNOSIS — G894 Chronic pain syndrome: Secondary | ICD-10-CM | POA: Diagnosis not present

## 2018-12-02 DIAGNOSIS — Z23 Encounter for immunization: Secondary | ICD-10-CM | POA: Diagnosis not present

## 2018-12-07 ENCOUNTER — Emergency Department (HOSPITAL_COMMUNITY): Payer: Medicare Other

## 2018-12-07 ENCOUNTER — Inpatient Hospital Stay (HOSPITAL_COMMUNITY): Payer: Medicare Other

## 2018-12-07 DIAGNOSIS — S022XXA Fracture of nasal bones, initial encounter for closed fracture: Secondary | ICD-10-CM | POA: Diagnosis present

## 2018-12-07 DIAGNOSIS — I499 Cardiac arrhythmia, unspecified: Secondary | ICD-10-CM | POA: Diagnosis not present

## 2018-12-07 DIAGNOSIS — Z888 Allergy status to other drugs, medicaments and biological substances status: Secondary | ICD-10-CM

## 2018-12-07 DIAGNOSIS — G931 Anoxic brain damage, not elsewhere classified: Secondary | ICD-10-CM | POA: Diagnosis present

## 2018-12-07 DIAGNOSIS — Y92008 Other place in unspecified non-institutional (private) residence as the place of occurrence of the external cause: Secondary | ICD-10-CM

## 2018-12-07 DIAGNOSIS — Z981 Arthrodesis status: Secondary | ICD-10-CM

## 2018-12-07 DIAGNOSIS — Z96651 Presence of right artificial knee joint: Secondary | ICD-10-CM | POA: Diagnosis present

## 2018-12-07 DIAGNOSIS — Z978 Presence of other specified devices: Secondary | ICD-10-CM

## 2018-12-07 DIAGNOSIS — S27321A Contusion of lung, unilateral, initial encounter: Secondary | ICD-10-CM | POA: Diagnosis present

## 2018-12-07 DIAGNOSIS — E039 Hypothyroidism, unspecified: Secondary | ICD-10-CM | POA: Diagnosis present

## 2018-12-07 DIAGNOSIS — E872 Acidosis: Secondary | ICD-10-CM | POA: Diagnosis present

## 2018-12-07 DIAGNOSIS — S12110A Anterior displaced Type II dens fracture, initial encounter for closed fracture: Secondary | ICD-10-CM | POA: Diagnosis present

## 2018-12-07 DIAGNOSIS — Z79890 Hormone replacement therapy (postmenopausal): Secondary | ICD-10-CM

## 2018-12-07 DIAGNOSIS — R0689 Other abnormalities of breathing: Secondary | ICD-10-CM | POA: Diagnosis not present

## 2018-12-07 DIAGNOSIS — Y9301 Activity, walking, marching and hiking: Secondary | ICD-10-CM | POA: Diagnosis present

## 2018-12-07 DIAGNOSIS — I4510 Unspecified right bundle-branch block: Secondary | ICD-10-CM | POA: Diagnosis present

## 2018-12-07 DIAGNOSIS — S1202XA Unstable burst fracture of first cervical vertebra, initial encounter for closed fracture: Secondary | ICD-10-CM | POA: Diagnosis present

## 2018-12-07 DIAGNOSIS — R680 Hypothermia, not associated with low environmental temperature: Secondary | ICD-10-CM | POA: Diagnosis present

## 2018-12-07 DIAGNOSIS — S12120A Other displaced dens fracture, initial encounter for closed fracture: Secondary | ICD-10-CM

## 2018-12-07 DIAGNOSIS — Z20828 Contact with and (suspected) exposure to other viral communicable diseases: Secondary | ICD-10-CM | POA: Diagnosis present

## 2018-12-07 DIAGNOSIS — I493 Ventricular premature depolarization: Secondary | ICD-10-CM | POA: Diagnosis present

## 2018-12-07 DIAGNOSIS — S0083XA Contusion of other part of head, initial encounter: Secondary | ICD-10-CM | POA: Diagnosis present

## 2018-12-07 DIAGNOSIS — M549 Dorsalgia, unspecified: Secondary | ICD-10-CM | POA: Diagnosis present

## 2018-12-07 DIAGNOSIS — G894 Chronic pain syndrome: Secondary | ICD-10-CM | POA: Diagnosis present

## 2018-12-07 DIAGNOSIS — J181 Lobar pneumonia, unspecified organism: Secondary | ICD-10-CM | POA: Diagnosis not present

## 2018-12-07 DIAGNOSIS — R402313 Coma scale, best motor response, none, at hospital admission: Secondary | ICD-10-CM | POA: Diagnosis present

## 2018-12-07 DIAGNOSIS — I469 Cardiac arrest, cause unspecified: Secondary | ICD-10-CM | POA: Diagnosis not present

## 2018-12-07 DIAGNOSIS — R404 Transient alteration of awareness: Secondary | ICD-10-CM | POA: Diagnosis not present

## 2018-12-07 DIAGNOSIS — Z886 Allergy status to analgesic agent status: Secondary | ICD-10-CM

## 2018-12-07 DIAGNOSIS — W1830XA Fall on same level, unspecified, initial encounter: Secondary | ICD-10-CM | POA: Diagnosis present

## 2018-12-07 DIAGNOSIS — Z79899 Other long term (current) drug therapy: Secondary | ICD-10-CM

## 2018-12-07 DIAGNOSIS — I471 Supraventricular tachycardia: Secondary | ICD-10-CM | POA: Diagnosis not present

## 2018-12-07 DIAGNOSIS — I5181 Takotsubo syndrome: Secondary | ICD-10-CM | POA: Diagnosis present

## 2018-12-07 DIAGNOSIS — J690 Pneumonitis due to inhalation of food and vomit: Secondary | ICD-10-CM | POA: Diagnosis present

## 2018-12-07 DIAGNOSIS — R918 Other nonspecific abnormal finding of lung field: Secondary | ICD-10-CM | POA: Diagnosis not present

## 2018-12-07 DIAGNOSIS — I462 Cardiac arrest due to underlying cardiac condition: Secondary | ICD-10-CM | POA: Diagnosis present

## 2018-12-07 DIAGNOSIS — S12190A Other displaced fracture of second cervical vertebra, initial encounter for closed fracture: Secondary | ICD-10-CM | POA: Diagnosis not present

## 2018-12-07 DIAGNOSIS — Z9181 History of falling: Secondary | ICD-10-CM

## 2018-12-07 DIAGNOSIS — R4020 Unspecified coma: Secondary | ICD-10-CM | POA: Diagnosis not present

## 2018-12-07 DIAGNOSIS — E875 Hyperkalemia: Secondary | ICD-10-CM | POA: Diagnosis not present

## 2018-12-07 DIAGNOSIS — F0280 Dementia in other diseases classified elsewhere without behavioral disturbance: Secondary | ICD-10-CM | POA: Diagnosis present

## 2018-12-07 DIAGNOSIS — R402213 Coma scale, best verbal response, none, at hospital admission: Secondary | ICD-10-CM | POA: Diagnosis present

## 2018-12-07 DIAGNOSIS — R402113 Coma scale, eyes open, never, at hospital admission: Secondary | ICD-10-CM | POA: Diagnosis present

## 2018-12-07 DIAGNOSIS — R9431 Abnormal electrocardiogram [ECG] [EKG]: Secondary | ICD-10-CM | POA: Diagnosis present

## 2018-12-07 DIAGNOSIS — J9601 Acute respiratory failure with hypoxia: Secondary | ICD-10-CM | POA: Diagnosis not present

## 2018-12-07 DIAGNOSIS — Z4682 Encounter for fitting and adjustment of non-vascular catheter: Secondary | ICD-10-CM | POA: Diagnosis not present

## 2018-12-07 DIAGNOSIS — E559 Vitamin D deficiency, unspecified: Secondary | ICD-10-CM | POA: Diagnosis present

## 2018-12-07 DIAGNOSIS — M810 Age-related osteoporosis without current pathological fracture: Secondary | ICD-10-CM | POA: Diagnosis present

## 2018-12-07 DIAGNOSIS — S13121A Dislocation of C1/C2 cervical vertebrae, initial encounter: Secondary | ICD-10-CM | POA: Diagnosis not present

## 2018-12-07 DIAGNOSIS — R570 Cardiogenic shock: Secondary | ICD-10-CM | POA: Diagnosis not present

## 2018-12-07 DIAGNOSIS — D649 Anemia, unspecified: Secondary | ICD-10-CM | POA: Diagnosis present

## 2018-12-07 DIAGNOSIS — S12100A Unspecified displaced fracture of second cervical vertebra, initial encounter for closed fracture: Secondary | ICD-10-CM | POA: Diagnosis not present

## 2018-12-07 DIAGNOSIS — Z9011 Acquired absence of right breast and nipple: Secondary | ICD-10-CM

## 2018-12-07 DIAGNOSIS — G309 Alzheimer's disease, unspecified: Secondary | ICD-10-CM | POA: Diagnosis present

## 2018-12-07 DIAGNOSIS — Z7189 Other specified counseling: Secondary | ICD-10-CM | POA: Diagnosis not present

## 2018-12-07 DIAGNOSIS — F419 Anxiety disorder, unspecified: Secondary | ICD-10-CM | POA: Diagnosis present

## 2018-12-07 DIAGNOSIS — S0990XA Unspecified injury of head, initial encounter: Secondary | ICD-10-CM | POA: Diagnosis not present

## 2018-12-07 DIAGNOSIS — E870 Hyperosmolality and hypernatremia: Secondary | ICD-10-CM | POA: Diagnosis not present

## 2018-12-07 DIAGNOSIS — Z91040 Latex allergy status: Secondary | ICD-10-CM

## 2018-12-07 DIAGNOSIS — I21A1 Myocardial infarction type 2: Secondary | ICD-10-CM | POA: Diagnosis present

## 2018-12-07 DIAGNOSIS — Z880 Allergy status to penicillin: Secondary | ICD-10-CM

## 2018-12-07 DIAGNOSIS — Z66 Do not resuscitate: Secondary | ICD-10-CM | POA: Diagnosis not present

## 2018-12-07 DIAGNOSIS — S299XXA Unspecified injury of thorax, initial encounter: Secondary | ICD-10-CM | POA: Diagnosis not present

## 2018-12-07 DIAGNOSIS — Z853 Personal history of malignant neoplasm of breast: Secondary | ICD-10-CM

## 2018-12-07 DIAGNOSIS — Z03818 Encounter for observation for suspected exposure to other biological agents ruled out: Secondary | ICD-10-CM | POA: Diagnosis not present

## 2018-12-07 LAB — TYPE AND SCREEN
ABO/RH(D): O POS
Antibody Screen: NEGATIVE

## 2018-12-07 LAB — I-STAT CHEM 8, ED
BUN: 10 mg/dL (ref 8–23)
Calcium, Ion: 1.16 mmol/L (ref 1.15–1.40)
Chloride: 104 mmol/L (ref 98–111)
Creatinine, Ser: 0.6 mg/dL (ref 0.44–1.00)
Glucose, Bld: 185 mg/dL — ABNORMAL HIGH (ref 70–99)
HCT: 36 % (ref 36.0–46.0)
Hemoglobin: 12.2 g/dL (ref 12.0–15.0)
Potassium: 4.1 mmol/L (ref 3.5–5.1)
Sodium: 137 mmol/L (ref 135–145)
TCO2: 23 mmol/L (ref 22–32)

## 2018-12-07 LAB — PROTIME-INR
INR: 1.1 (ref 0.8–1.2)
INR: 1.1 (ref 0.8–1.2)
Prothrombin Time: 14.3 seconds (ref 11.4–15.2)
Prothrombin Time: 14.5 seconds (ref 11.4–15.2)

## 2018-12-07 LAB — POCT I-STAT, CHEM 8
BUN: 12 mg/dL (ref 8–23)
Calcium, Ion: 1.03 mmol/L — ABNORMAL LOW (ref 1.15–1.40)
Chloride: 112 mmol/L — ABNORMAL HIGH (ref 98–111)
Creatinine, Ser: 0.4 mg/dL — ABNORMAL LOW (ref 0.44–1.00)
Glucose, Bld: 226 mg/dL — ABNORMAL HIGH (ref 70–99)
HCT: 41 % (ref 36.0–46.0)
Hemoglobin: 13.9 g/dL (ref 12.0–15.0)
Potassium: 3.5 mmol/L (ref 3.5–5.1)
Sodium: 141 mmol/L (ref 135–145)
TCO2: 16 mmol/L — ABNORMAL LOW (ref 22–32)

## 2018-12-07 LAB — POCT I-STAT 7, (LYTES, BLD GAS, ICA,H+H)
Acid-base deficit: 8 mmol/L — ABNORMAL HIGH (ref 0.0–2.0)
Bicarbonate: 19 mmol/L — ABNORMAL LOW (ref 20.0–28.0)
Calcium, Ion: 1.13 mmol/L — ABNORMAL LOW (ref 1.15–1.40)
HCT: 38 % (ref 36.0–46.0)
Hemoglobin: 12.9 g/dL (ref 12.0–15.0)
O2 Saturation: 99 %
Patient temperature: 98
Potassium: 3 mmol/L — ABNORMAL LOW (ref 3.5–5.1)
Sodium: 140 mmol/L (ref 135–145)
TCO2: 20 mmol/L — ABNORMAL LOW (ref 22–32)
pCO2 arterial: 42.9 mmHg (ref 32.0–48.0)
pH, Arterial: 7.252 — ABNORMAL LOW (ref 7.350–7.450)
pO2, Arterial: 150 mmHg — ABNORMAL HIGH (ref 83.0–108.0)

## 2018-12-07 LAB — BASIC METABOLIC PANEL
Anion gap: 8 (ref 5–15)
BUN: 9 mg/dL (ref 8–23)
CO2: 23 mmol/L (ref 22–32)
Calcium: 8.1 mg/dL — ABNORMAL LOW (ref 8.9–10.3)
Chloride: 106 mmol/L (ref 98–111)
Creatinine, Ser: 0.94 mg/dL (ref 0.44–1.00)
GFR calc Af Amer: >60 mL/min (ref >60)
GFR calc non Af Amer: 57 mL/min — ABNORMAL LOW (ref >60)
Glucose, Bld: 191 mg/dL — ABNORMAL HIGH (ref 70–99)
Potassium: 4.2 mmol/L (ref 3.5–5.1)
Sodium: 137 mmol/L (ref 135–145)

## 2018-12-07 LAB — CBC
HCT: 36.8 % (ref 36.0–46.0)
Hemoglobin: 11.6 g/dL — ABNORMAL LOW (ref 12.0–15.0)
MCH: 29.6 pg (ref 26.0–34.0)
MCHC: 31.5 g/dL (ref 30.0–36.0)
MCV: 93.9 fL (ref 80.0–100.0)
Platelets: 237 10*3/uL (ref 150–400)
RBC: 3.92 MIL/uL (ref 3.87–5.11)
RDW: 14.5 % (ref 11.5–15.5)
WBC: 9.2 10*3/uL (ref 4.0–10.5)
nRBC: 0 % (ref 0.0–0.2)

## 2018-12-07 LAB — MAGNESIUM: Magnesium: 1.9 mg/dL (ref 1.7–2.4)

## 2018-12-07 LAB — GLUCOSE, CAPILLARY
Glucose-Capillary: 192 mg/dL — ABNORMAL HIGH (ref 70–99)
Glucose-Capillary: 230 mg/dL — ABNORMAL HIGH (ref 70–99)
Glucose-Capillary: 147 mg/dL — ABNORMAL HIGH (ref 70–99)
Glucose-Capillary: 212 mg/dL — ABNORMAL HIGH (ref 70–99)
Glucose-Capillary: 199 mg/dL — ABNORMAL HIGH (ref 70–99)
Glucose-Capillary: 194 mg/dL — ABNORMAL HIGH (ref 70–99)
Glucose-Capillary: 183 mg/dL — ABNORMAL HIGH (ref 70–99)

## 2018-12-07 LAB — TROPONIN I (HIGH SENSITIVITY)
Troponin I (High Sensitivity): 25 ng/L — ABNORMAL HIGH (ref <18)
Troponin I (High Sensitivity): 1235 ng/L (ref <18)
Troponin I (High Sensitivity): 1279 ng/L (ref <18)

## 2018-12-07 LAB — APTT: aPTT: 30 seconds (ref 24–36)

## 2018-12-07 LAB — SARS CORONAVIRUS 2 BY RT PCR (HOSPITAL ORDER, PERFORMED IN ~~LOC~~ HOSPITAL LAB): SARS Coronavirus 2: NEGATIVE

## 2018-12-07 LAB — CREATININE, SERUM
Creatinine, Ser: 0.64 mg/dL (ref 0.44–1.00)
GFR calc Af Amer: >60 mL/min (ref >60)
GFR calc non Af Amer: >60 mL/min (ref >60)

## 2018-12-07 LAB — ETHANOL: Alcohol, Ethyl (B): <10 mg/dL (ref <10)

## 2018-12-07 LAB — POCT I-STAT 4, (NA,K, GLUC, HGB,HCT)
Glucose, Bld: 216 mg/dL — ABNORMAL HIGH (ref 70–99)
HCT: 42 % (ref 36.0–46.0)
Hemoglobin: 14.3 g/dL (ref 12.0–15.0)
Potassium: 5.3 mmol/L — ABNORMAL HIGH (ref 3.5–5.1)
Sodium: 137 mmol/L (ref 135–145)

## 2018-12-07 LAB — MRSA PCR SCREENING: MRSA by PCR: NEGATIVE

## 2018-12-07 LAB — LACTIC ACID, PLASMA: Lactic Acid, Venous: 4.3 mmol/L (ref 0.5–1.9)

## 2018-12-07 LAB — TSH: TSH: 1.077 u[IU]/mL (ref 0.350–4.500)

## 2018-12-07 MED ORDER — LEVOTHYROXINE SODIUM 50 MCG PO TABS
50.0000 ug | ORAL_TABLET | Freq: Every day | ORAL | Status: DC
  Administered 2018-12-08 – 2018-12-11 (×4): 50 ug
  Filled 2018-12-07 (×4): qty 1

## 2018-12-07 MED ORDER — SODIUM CHLORIDE 0.9 % IV SOLN
1.0000 g | INTRAVENOUS | Status: AC
  Administered 2018-12-07: 1 g via INTRAVENOUS
  Filled 2018-12-07: qty 1
  Filled 2018-12-07: qty 10

## 2018-12-07 MED ORDER — EPINEPHRINE HCL 5 MG/250ML IV SOLN IN NS
0.5000 ug/min | INTRAVENOUS | Status: DC
  Administered 2018-12-07: 2 ug/min via INTRAVENOUS
  Filled 2018-12-07: qty 250

## 2018-12-07 MED ORDER — INSULIN ASPART 100 UNIT/ML ~~LOC~~ SOLN
1.0000 [IU] | SUBCUTANEOUS | Status: DC
  Administered 2018-12-07 (×2): 3 [IU] via SUBCUTANEOUS
  Administered 2018-12-08 (×4): 2 [IU] via SUBCUTANEOUS
  Administered 2018-12-08 (×2): 3 [IU] via SUBCUTANEOUS
  Administered 2018-12-09: 2 [IU] via SUBCUTANEOUS
  Administered 2018-12-09: 1 [IU] via SUBCUTANEOUS
  Administered 2018-12-09 (×3): 2 [IU] via SUBCUTANEOUS
  Administered 2018-12-09: 1 [IU] via SUBCUTANEOUS
  Administered 2018-12-10 – 2018-12-11 (×8): 2 [IU] via SUBCUTANEOUS
  Administered 2018-12-11: 1 [IU] via SUBCUTANEOUS
  Administered 2018-12-11 (×2): 2 [IU] via SUBCUTANEOUS

## 2018-12-07 MED ORDER — FENTANYL BOLUS VIA INFUSION
25.0000 ug | INTRAVENOUS | Status: DC | PRN
  Filled 2018-12-07: qty 25

## 2018-12-07 MED ORDER — SODIUM CHLORIDE 0.9 % IV SOLN
0.5000 ug/kg/min | INTRAVENOUS | Status: DC
  Administered 2018-12-07 – 2018-12-08 (×3): 3 ug/kg/min via INTRAVENOUS
  Filled 2018-12-07 (×4): qty 20

## 2018-12-07 MED ORDER — SODIUM CHLORIDE 0.9% FLUSH: 10.0000 mL | INTRAVENOUS | Status: DC | PRN

## 2018-12-07 MED ORDER — NITROGLYCERIN 2 % TD OINT: 1.0000 [in_us] | TOPICAL_OINTMENT | Freq: Three times a day (TID) | TRANSDERMAL | Status: DC

## 2018-12-07 MED ORDER — FENTANYL 2500MCG IN NS 250ML (10MCG/ML) PREMIX INFUSION
0.0000 ug/h | INTRAVENOUS | Status: DC
  Administered 2018-12-07 – 2018-12-08 (×2): 100 ug/h via INTRAVENOUS
  Filled 2018-12-07 (×2): qty 250

## 2018-12-07 MED ORDER — CHLORHEXIDINE GLUCONATE CLOTH 2 % EX PADS
6.0000 | MEDICATED_PAD | Freq: Every day | CUTANEOUS | Status: DC
  Administered 2018-12-07 – 2018-12-11 (×4): 6 via TOPICAL

## 2018-12-07 MED ORDER — MAGNESIUM SULFATE 2 GM/50ML IV SOLN
2.0000 g | Freq: Once | INTRAVENOUS | Status: AC
  Administered 2018-12-07: 18:00:00 2 g via INTRAVENOUS
  Filled 2018-12-07: qty 50

## 2018-12-07 MED ORDER — MIDAZOLAM BOLUS VIA INFUSION
1.0000 mg | INTRAVENOUS | Status: DC | PRN
  Filled 2018-12-07: qty 1

## 2018-12-07 MED ORDER — SODIUM CHLORIDE 0.9 % IV SOLN
500.0000 mg | Freq: Once | INTRAVENOUS | Status: AC
  Administered 2018-12-07: 500 mg via INTRAVENOUS
  Filled 2018-12-07: qty 500

## 2018-12-07 MED ORDER — NOREPINEPHRINE 4 MG/250ML-% IV SOLN
0.0000 ug/min | INTRAVENOUS | Status: DC
  Administered 2018-12-07 (×2): 12 ug/min via INTRAVENOUS
  Administered 2018-12-08: 6 ug/min via INTRAVENOUS
  Administered 2018-12-08: 5 ug/min via INTRAVENOUS
  Administered 2018-12-09: 8 ug/min via INTRAVENOUS
  Administered 2018-12-09: 3 ug/min via INTRAVENOUS
  Administered 2018-12-09: 07:00:00 9 ug/min via INTRAVENOUS
  Filled 2018-12-07 (×7): qty 250

## 2018-12-07 MED ORDER — MIDAZOLAM 50MG/50ML (1MG/ML) PREMIX INFUSION
0.0000 mg/h | INTRAVENOUS | Status: DC
  Administered 2018-12-07 – 2018-12-09 (×3): 2 mg/h via INTRAVENOUS
  Filled 2018-12-07 (×3): qty 50

## 2018-12-07 MED ORDER — MIDAZOLAM HCL 2 MG/2ML IJ SOLN
1.0000 mg | Freq: Once | INTRAMUSCULAR | Status: AC
  Administered 2018-12-07: 1 mg via INTRAVENOUS

## 2018-12-07 MED ORDER — SODIUM CHLORIDE 0.9 % IV SOLN
1.0000 g | Freq: Once | INTRAVENOUS | Status: AC
  Administered 2018-12-07: 12:00:00 1 g via INTRAVENOUS
  Filled 2018-12-07: qty 10

## 2018-12-07 MED ORDER — CHLORHEXIDINE GLUCONATE 0.12% ORAL RINSE (MEDLINE KIT)
15.0000 mL | Freq: Two times a day (BID) | OROMUCOSAL | Status: DC
  Administered 2018-12-07 – 2018-12-11 (×8): 15 mL via OROMUCOSAL

## 2018-12-07 MED ORDER — SODIUM CHLORIDE 0.9% FLUSH
10.0000 mL | Freq: Two times a day (BID) | INTRAVENOUS | Status: DC
  Administered 2018-12-09 – 2018-12-10 (×2): 10 mL

## 2018-12-07 MED ORDER — BACITRACIN-NEOMYCIN-POLYMYXIN OINTMENT TUBE
TOPICAL_OINTMENT | CUTANEOUS | Status: DC | PRN
  Administered 2018-12-07: 19:00:00 via TOPICAL
  Filled 2018-12-07: qty 14

## 2018-12-07 MED ORDER — ARTIFICIAL TEARS OPHTHALMIC OINT
1.0000 "application " | TOPICAL_OINTMENT | Freq: Three times a day (TID) | OPHTHALMIC | Status: DC
  Administered 2018-12-07 – 2018-12-09 (×6): 1 via OPHTHALMIC
  Filled 2018-12-07: qty 3.5

## 2018-12-07 MED ORDER — FAMOTIDINE IN NACL 20-0.9 MG/50ML-% IV SOLN
20.0000 mg | Freq: Two times a day (BID) | INTRAVENOUS | Status: DC
  Administered 2018-12-07 – 2018-12-11 (×9): 20 mg via INTRAVENOUS
  Filled 2018-12-07 (×9): qty 50

## 2018-12-07 MED ORDER — NOREPINEPHRINE 4 MG/250ML-% IV SOLN
0.0000 ug/min | INTRAVENOUS | Status: DC
  Administered 2018-12-07: 10 ug/min via INTRAVENOUS

## 2018-12-07 MED ORDER — SODIUM CHLORIDE 0.9 % IV SOLN
2.0000 g | INTRAVENOUS | Status: DC
  Administered 2018-12-08 – 2018-12-11 (×4): 2 g via INTRAVENOUS
  Filled 2018-12-07 (×3): qty 2
  Filled 2018-12-07: qty 20
  Filled 2018-12-07: qty 2

## 2018-12-07 MED ORDER — SODIUM CHLORIDE 0.9% FLUSH
3.0000 mL | Freq: Once | INTRAVENOUS | Status: AC
  Administered 2018-12-07: 11:00:00 3 mL via INTRAVENOUS

## 2018-12-07 MED ORDER — SODIUM CHLORIDE 0.9 % IV SOLN
INTRAVENOUS | Status: DC | PRN
  Administered 2018-12-08: 500 mL via INTRAVENOUS

## 2018-12-07 MED ORDER — IOHEXOL 350 MG/ML SOLN
100.0000 mL | Freq: Once | INTRAVENOUS | Status: AC | PRN
  Administered 2018-12-07: 58 mL via INTRAVENOUS

## 2018-12-07 MED ORDER — ASPIRIN 300 MG RE SUPP
300.0000 mg | RECTAL | Status: AC
  Administered 2018-12-07: 300 mg via RECTAL
  Filled 2018-12-07: qty 1

## 2018-12-07 MED ORDER — METRONIDAZOLE IN NACL 5-0.79 MG/ML-% IV SOLN
500.0000 mg | Freq: Three times a day (TID) | INTRAVENOUS | Status: DC
  Administered 2018-12-07 – 2018-12-10 (×9): 500 mg via INTRAVENOUS
  Filled 2018-12-07 (×9): qty 100

## 2018-12-07 MED ORDER — POTASSIUM CHLORIDE 10 MEQ/50ML IV SOLN
10.0000 meq | INTRAVENOUS | Status: AC
  Administered 2018-12-07 (×4): 10 meq via INTRAVENOUS
  Filled 2018-12-07 (×4): qty 50

## 2018-12-07 MED ORDER — PHENTOLAMINE MESYLATE 5 MG IJ SOLR
5.0000 mg | Freq: Once | INTRAMUSCULAR | Status: AC
  Administered 2018-12-07: 5 mg via SUBCUTANEOUS
  Filled 2018-12-07: qty 5

## 2018-12-07 MED ORDER — FENTANYL CITRATE (PF) 100 MCG/2ML IJ SOLN
50.0000 ug | Freq: Once | INTRAMUSCULAR | Status: AC
  Administered 2018-12-07: 16:00:00 50 ug via INTRAVENOUS

## 2018-12-07 MED ORDER — EPINEPHRINE 1 MG/10ML IJ SOSY
PREFILLED_SYRINGE | INTRAMUSCULAR | Status: AC | PRN
  Administered 2018-12-07: 1 mg via INTRAVENOUS

## 2018-12-07 MED ORDER — SODIUM CHLORIDE 0.9 % IV SOLN
INTRAVENOUS | Status: DC
  Administered 2018-12-07 – 2018-12-08 (×3): via INTRAVENOUS

## 2018-12-07 MED ORDER — NOREPINEPHRINE BITARTRATE 1 MG/ML IV SOLN: 2.0000 ug/kg/min | Freq: Once | INTRAVENOUS | Status: DC

## 2018-12-07 MED ORDER — ORAL CARE MOUTH RINSE
15.0000 mL | OROMUCOSAL | Status: DC
  Administered 2018-12-07 – 2018-12-11 (×38): 15 mL via OROMUCOSAL

## 2018-12-07 MED ORDER — ROCURONIUM BROMIDE 50 MG/5ML IV SOLN
INTRAVENOUS | Status: AC | PRN
  Administered 2018-12-07: 100 mg via INTRAVENOUS

## 2018-12-07 MED ORDER — HEPARIN SODIUM (PORCINE) 5000 UNIT/ML IJ SOLN
5000.0000 [IU] | Freq: Three times a day (TID) | INTRAMUSCULAR | Status: DC
  Administered 2018-12-07 – 2018-12-11 (×13): 5000 [IU] via SUBCUTANEOUS
  Filled 2018-12-07 (×13): qty 1

## 2018-12-07 NOTE — Progress Notes (Signed)
  Echocardiogram 2D Echocardiogram has been performed.  Colleen [name] 12/13/2018, 4:42 PM

## 2018-12-07 NOTE — Progress Notes (Signed)
While rounding I visited with patient per nurse. Per daughter Patient fail and possibly had cardiac arrest.  Per EDP pt. Broke neck and other injuries.  Accompanied EDP to talk with daughter.  Patient will be admitted.  Provided emotional and spiritual support as needed.  Daughter waiting to go to bedside.  Will follow as needed. Jaclynn Major, [place], [facility], Pager [phone]

## 2018-12-07 NOTE — Progress Notes (Signed)
Pt having echo done at this time - will set EEG up after.

## 2018-12-07 NOTE — Procedures (Signed)
Central Venous Catheter Insertion Procedure Note Kinue Gudeman [id number] 05-Jan-1935  Procedure: Insertion of Central Venous Catheter Indications: Drug and/or fluid administration and Frequent blood sampling  Procedure Details Consent: Risks of procedure as well as the alternatives and risks of each were explained to the (patient/caregiver).  Consent for procedure obtained. Time Out: Verified patient identification, verified procedure, site/side was marked, verified correct patient position, special equipment/implants available, medications/allergies/relevent history reviewed, required imaging and test results available.  Performed  Maximum sterile technique was used including antiseptics, cap, gloves, gown, hand hygiene, mask and sheet. Skin prep: Chlorhexidine; local anesthetic administered A antimicrobial bonded/coated triple lumen catheter was placed in the left femoral vein due to cervical collar in place and high , no other available access using the Seldinger technique.  Evaluation Blood flow good Complications: No apparent complications Patient did tolerate procedure well.    Otilio Carpen Juniper Cobey 12/14/2018, 3:06 PM

## 2018-12-07 NOTE — ED Notes (Signed)
CCM at bedside 

## 2018-12-07 NOTE — Progress Notes (Signed)
Home medications sent home with family.

## 2018-12-07 NOTE — ED Notes (Signed)
Over in CT with this patient

## 2018-12-07 NOTE — ED Triage Notes (Signed)
Pt in via [facility] as post-CPR after falling at home. Per husband, pt woke up normal and fell in the hallway, becoming unconscious. When EMS arrived, pt was lying in a pool of blood under her head. Has head lac back of head and bridge of nose present. EMS did 10 min CPR at 0915 and regained pulses, R BBB. King airway present on arrival, CBG 247

## 2018-12-07 NOTE — Progress Notes (Signed)
On call MD contacted for potassium 3.0, CBG 230, critical troponin 1235, antibiotic ointment for head lacerations, and no orders placed for Nimbex per protocol. MD states orders will be received shortly.

## 2018-12-07 NOTE — Consult Note (Signed)
Reason for Consult: Cervical fracture Referring Physician: ER  Colleen [name] is an 84 y.o. female.  HPI: 84 year old female who appears to have had a primary cardiac arrest versus pulmonary embolus collapsed fell requiring CPR to resuscitate patients intubated and unresponsive.  Work-up revealed C1-C2 fractures.  Past Medical History:  Diagnosis Date  . Arthritis   . Cancer (HCC)    rt breast  . Chronic back pain   . Headache   . Heart murmur   . Hypertension   . Hypothyroidism   . Mitral valve disorder   . Osteoporosis   . Ulnar shaft fracture    right  . Vitamin D deficiency     Past Surgical History:  Procedure Laterality Date  . ABDOMINAL HYSTERECTOMY    . APPENDECTOMY    . BACK SURGERY    . BIOPSY BREAST     left  . BREAST RECONSTRUCTION WITH PLACEMENT OF TISSUE EXPANDER AND FLEX HD (ACELLULAR HYDRATED DERMIS)     right breast,  left  reduction   . BREAST SURGERY    . CARPAL TUNNEL RELEASE     right  . CATARACT EXTRACTION W/ INTRAOCULAR LENS  IMPLANT, BILATERAL    . CERVICAL FUSION     x2  . FINGER MASS EXCISION     left little finger  . JOINT REPLACEMENT Right 01-2015  . KNEE ARTHROSCOPY     right  . MASTECTOMY Right   . ORIF ULNAR FRACTURE Right 06/27/2015   Procedure: OPEN REDUCTION INTERNAL FIXATION (ORIF) ULNAR FRACTURE;  Surgeon: [name], MD;  Location: [facility];  Service: Orthopedics;  Laterality: Right;  . TONSILLECTOMY    . TOTAL KNEE ARTHROPLASTY Right 01/23/2015   Procedure: TOTAL KNEE ARTHROPLASTY;  Surgeon: [name], MD;  Location: [facility];  Service: Orthopedics;  Laterality: Right;  . TOTAL MASTECTOMY     right breast  . VARICOSE VEIN SURGERY      Family History  Problem Relation Age of Onset  . Cancer Father   . Depression Sister   . Anxiety disorder Sister   . Cancer Sister   . Cancer Brother     Social History:  reports that she has never smoked. She has never used smokeless tobacco. She reports that she  does not drink alcohol or use drugs.  Allergies:  Allergies  Allergen Reactions  . Bee Venom Anaphylaxis, Hives, Itching and Swelling  . Nsaids Hives  . Nutritional Supplements Hives  . Celebrex [Celecoxib] Other (See Comments)    unknown  . Feldene [Piroxicam] Other (See Comments)    unknown  . Latex Rash  . Penicillins Rash    Received 2 GM Ancef with no obvious reaction    Medications: I have reviewed the patient's current medications.  Results for orders placed or performed during the hospital encounter of 11/22/2018 (from the past 48 hour(s))  Type and screen Industry     Status: None   Collection Time: 11/21/2018 10:20 AM  Result Value Ref Range   ABO/RH(D) O POS    Antibody Screen NEG    Sample Expiration      12/10/2018,2359 Performed at [facility], [place] [address], [name], [place] [id number]   Basic metabolic panel     Status: Abnormal   Collection Time: 11/20/2018 10:31 AM  Result Value Ref Range   Sodium 137 135 - 145 mmol/L   Potassium 4.2 3.5 - 5.1 mmol/L   Chloride 106 98 -  111 mmol/L   CO2 23 22 - 32 mmol/L   Glucose, Bld 191 (H) 70 - 99 mg/dL   BUN 9 8 - 23 mg/dL   Creatinine, Ser 0.94 0.44 - 1.00 mg/dL   Calcium 8.1 (L) 8.9 - 10.3 mg/dL   GFR calc non Af Amer 57 (L) >60 mL/min   GFR calc Af Amer >60 >60 mL/min   Anion gap 8 5 - 15    Comment: Performed at [facility] [address], [place], [place] [address]  CBC     Status: Abnormal   Collection Time: 11/20/2018 10:31 AM  Result Value Ref Range   WBC 9.2 4.0 - 10.5 K/uL   RBC 3.92 3.87 - 5.11 MIL/uL   Hemoglobin 11.6 (L) 12.0 - 15.0 g/dL   HCT 36.8 36.0 - 46.0 %   MCV 93.9 80.0 - 100.0 fL   MCH 29.6 26.0 - 34.0 pg   MCHC 31.5 30.0 - 36.0 g/dL   RDW 14.5 11.5 - 15.5 %   Platelets 237 150 - 400 K/uL   nRBC 0.0 0.0 - 0.2 %    Comment: Performed at [facility], [place] [address], [place], [place] [address]  Troponin I (High Sensitivity)     Status: Abnormal    Collection Time: 11/17/2018 10:31 AM  Result Value Ref Range   Troponin I (High Sensitivity) 25 (H) <18 ng/L    Comment: (NOTE) Elevated high sensitivity troponin I (hsTnI) values and significant  changes across serial measurements may suggest ACS but many other  chronic and acute conditions are known to elevate hsTnI results.  Refer to the "Links" section for chest pain algorithms and additional  guidance. Performed at [facility], [place] [address] [place], [place] [address]   Lactic acid, plasma     Status: Abnormal   Collection Time: 11/18/2018 10:31 AM  Result Value Ref Range   Lactic Acid, Venous 4.3 (HH) 0.5 - 1.9 mmol/L    Comment: CRITICAL RESULT CALLED TO, READ BACK BY AND VERIFIED WITH: Bethena Midget RN [id number] BY A BENNETT Performed at [facility], [place] [address] [place], [place] [address]   Protime-INR     Status: None   Collection Time: 12/10/2018 10:31 AM  Result Value Ref Range   Prothrombin Time 14.3 11.4 - 15.2 seconds   INR 1.1 0.8 - 1.2    Comment: (NOTE) INR goal varies based on device and disease states. Performed at [facility], [place] [address] [place], [place] [address]   I-stat chem 8, ED (not at [facility] or [facility])     Status: Abnormal   Collection Time: 11/20/2018 10:59 AM  Result Value Ref Range   Sodium 137 135 - 145 mmol/L   Potassium 4.1 3.5 - 5.1 mmol/L   Chloride 104 98 - 111 mmol/L   BUN 10 8 - 23 mg/dL   Creatinine, Ser 0.60 0.44 - 1.00 mg/dL   Glucose, Bld 185 (H) 70 - 99 mg/dL   Calcium, Ion 1.16 1.15 - 1.40 mmol/L   TCO2 23 22 - 32 mmol/L   Hemoglobin 12.2 12.0 - 15.0 g/dL   HCT 36.0 36.0 - 46.0 %  SARS Coronavirus 2 [facility] order, Performed in [facility] hospital lab) Nasopharyngeal Nasopharyngeal Swab     Status: None   Collection Time: 11/16/2018 11:08 AM   Specimen: Nasopharyngeal Swab  Result Value Ref Range   SARS Coronavirus 2 NEGATIVE NEGATIVE  Comment: (NOTE) If result is NEGATIVE SARS-CoV-2 target nucleic acids  are NOT DETECTED. The SARS-CoV-2 RNA is generally detectable in upper and lower  respiratory specimens during the acute phase of infection. The lowest  concentration of SARS-CoV-2 viral copies this assay can detect is 250  copies / mL. A negative result does not preclude SARS-CoV-2 infection  and should not be used as the sole basis for treatment or other  patient management decisions.  A negative result may occur with  improper specimen collection / handling, submission of specimen other  than nasopharyngeal swab, presence of viral mutation(s) within the  areas targeted by this assay, and inadequate number of viral copies  (<250 copies / mL). A negative result must be combined with clinical  observations, patient history, and epidemiological information. If result is POSITIVE SARS-CoV-2 target nucleic acids are DETECTED. The SARS-CoV-2 RNA is generally detectable in upper and lower  respiratory specimens dur ing the acute phase of infection.  Positive  results are indicative of active infection with SARS-CoV-2.  Clinical  correlation with patient history and other diagnostic information is  necessary to determine patient infection status.  Positive results do  not rule out bacterial infection or co-infection with other viruses. If result is PRESUMPTIVE POSTIVE SARS-CoV-2 nucleic acids MAY BE PRESENT.   A presumptive positive result was obtained on the submitted specimen  and confirmed on repeat testing.  While 2019 novel coronavirus  (SARS-CoV-2) nucleic acids may be present in the submitted sample  additional confirmatory testing may be necessary for epidemiological  and / or clinical management purposes  to differentiate between  SARS-CoV-2 and other Sarbecovirus currently known to infect humans.  If clinically indicated additional testing with an alternate test  methodology [phone]) is advised. The SARS-CoV-2 RNA is generally  detectable in upper and lower respiratory sp ecimens  during the acute  phase of infection. The expected result is Negative. Fact Sheet for Patients:  StrictlyIdeas.no Fact Sheet for Healthcare Providers: BankingDealers.co.za This test is not yet approved or cleared by the [place] FDA and has been authorized for detection and/or diagnosis of SARS-CoV-2 by FDA under an Emergency Use Authorization (EUA).  This EUA will remain in effect (meaning this test can be used) for the duration of the COVID-19 declaration under Section 564(b)(1) of the Act, 21 U.S.C. section 360bbb-3(b)(1), unless the authorization is terminated or revoked sooner. Performed at [facility], [place] [address] [place], [place] [address]   I-STAT 7, (LYTES, BLD GAS, ICA, H+H)     Status: Abnormal   Collection Time: 12/01/2018 12:07 PM  Result Value Ref Range   pH, Arterial 7.252 (L) 7.350 - 7.450   pCO2 arterial 42.9 32.0 - 48.0 mmHg   pO2, Arterial 150.0 (H) 83.0 - 108.0 mmHg   Bicarbonate 19.0 (L) 20.0 - 28.0 mmol/L   TCO2 20 (L) 22 - 32 mmol/L   O2 Saturation 99.0 %   Acid-base deficit 8.0 (H) 0.0 - 2.0 mmol/L   Sodium 140 135 - 145 mmol/L   Potassium 3.0 (L) 3.5 - 5.1 mmol/L   Calcium, Ion 1.13 (L) 1.15 - 1.40 mmol/L   HCT 38.0 36.0 - 46.0 %   Hemoglobin 12.9 12.0 - 15.0 g/dL   Patient temperature 98.0 F    Sample type ARTERIAL     Ct Head Wo Contrast  Result Date: 12/03/2018 CLINICAL DATA:  Head trauma EXAM: CT HEAD WITHOUT CONTRAST CT MAXILLOFACIAL WITHOUT CONTRAST CT CERVICAL SPINE WITHOUT CONTRAST TECHNIQUE: Multidetector CT  imaging of the head, cervical spine, and maxillofacial structures were performed using the standard protocol without intravenous contrast. Multiplanar CT image reconstructions of the cervical spine and maxillofacial structures were also generated. COMPARISON:  None. FINDINGS: CT HEAD FINDINGS Brain: No evidence of acute infarction, hemorrhage, hydrocephalus, extra-axial collection or  mass lesion/mass effect. Mild periventricular white matter hypodensity. Vascular: No hyperdense vessel or unexpected calcification. CT FACIAL BONES FINDINGS Skull: Normal. Negative for fracture or focal lesion. Facial bones: Mildly displaced fractures of the nasal bones. Sinuses/Orbits: No acute finding. Other: Soft tissue laceration and hematoma of the forehead. Endotracheal and orogastric intubation. CT CERVICAL SPINE FINDINGS Alignment: Normal. Skull base and vertebrae: There are multipart, mildly displaced Jefferson type fractures of C1, with left and right-sided fractures of the anterior arch (series 10, image 10) and two fractures of the midline posterior and left aspect of the posterior arch (series 10, image 16). There is a mildly displaced transverse type II fracture of the dens (series 9, image 25). Soft tissues and spinal canal: No prevertebral fluid or swelling. No visible canal hematoma. Disc levels: There is anterior cervical discectomy and fusion from C4 through C7 with additional bony ankylosis of C3-C4. Upper chest: Extensive heterogeneous opacity of the partially included left pulmonary apex. Partially imaged right pleural effusion with scattered irregular and ground-glass opacity of the right pulmonary apex. Findings may reflect pulmonary contusion or multifocal airspace disease. There is a probable nondisplaced fracture of the anterior left second rib (series 4, image 103). Other: None. IMPRESSION: 1.  No acute intracranial pathology. 2.  Soft tissue laceration and hematoma of the forehead. 3.  Mildly displaced fractures of the nasal bones. 4. There are multipart, mildly displaced Jefferson type fractures of C1, with left and right-sided fractures of the anterior arch (series 10, image 10) and two fractures of the midline posterior and left aspect of the posterior arch (series 10, image 16). 5. There is a mildly displaced transverse type II fracture of the dens (series 9, image 25). 6. There is  anterior cervical discectomy and fusion from C4 through C7 with additional bony ankylosis of C3-C4. 7. Extensive heterogeneous opacity of the partially included left pulmonary apex. Partially imaged right pleural effusion with scattered irregular and ground-glass opacity of the right pulmonary apex. Findings may reflect pulmonary contusion or multifocal airspace disease. Consider dedicated imaging of the chest. There is a probable nondisplaced fracture of the anterior left second rib (series 4, image 103). There is no displaced fracture or pneumothorax in the partially imaged portions of the chest. These results were called by telephone at the time of interpretation on 12/13/2018 at 11:33 a.m. to provider Dr. Vanita Panda, who verbally acknowledged these results. Electronically Signed   By: Eddie Candle M.D.   On: 12/10/2018 11:37   Ct Cervical Spine Wo Contrast  Result Date: 12/09/2018 CLINICAL DATA:  Head trauma EXAM: CT HEAD WITHOUT CONTRAST CT MAXILLOFACIAL WITHOUT CONTRAST CT CERVICAL SPINE WITHOUT CONTRAST TECHNIQUE: Multidetector CT imaging of the head, cervical spine, and maxillofacial structures were performed using the standard protocol without intravenous contrast. Multiplanar CT image reconstructions of the cervical spine and maxillofacial structures were also generated. COMPARISON:  None. FINDINGS: CT HEAD FINDINGS Brain: No evidence of acute infarction, hemorrhage, hydrocephalus, extra-axial collection or mass lesion/mass effect. Mild periventricular white matter hypodensity. Vascular: No hyperdense vessel or unexpected calcification. CT FACIAL BONES FINDINGS Skull: Normal. Negative for fracture or focal lesion. Facial bones: Mildly displaced fractures of the nasal bones. Sinuses/Orbits: No acute finding. Other: Soft  tissue laceration and hematoma of the forehead. Endotracheal and orogastric intubation. CT CERVICAL SPINE FINDINGS Alignment: Normal. Skull base and vertebrae: There are multipart, mildly  displaced Jefferson type fractures of C1, with left and right-sided fractures of the anterior arch (series 10, image 10) and two fractures of the midline posterior and left aspect of the posterior arch (series 10, image 16). There is a mildly displaced transverse type II fracture of the dens (series 9, image 25). Soft tissues and spinal canal: No prevertebral fluid or swelling. No visible canal hematoma. Disc levels: There is anterior cervical discectomy and fusion from C4 through C7 with additional bony ankylosis of C3-C4. Upper chest: Extensive heterogeneous opacity of the partially included left pulmonary apex. Partially imaged right pleural effusion with scattered irregular and ground-glass opacity of the right pulmonary apex. Findings may reflect pulmonary contusion or multifocal airspace disease. There is a probable nondisplaced fracture of the anterior left second rib (series 4, image 103). Other: None. IMPRESSION: 1.  No acute intracranial pathology. 2.  Soft tissue laceration and hematoma of the forehead. 3.  Mildly displaced fractures of the nasal bones. 4. There are multipart, mildly displaced Jefferson type fractures of C1, with left and right-sided fractures of the anterior arch (series 10, image 10) and two fractures of the midline posterior and left aspect of the posterior arch (series 10, image 16). 5. There is a mildly displaced transverse type II fracture of the dens (series 9, image 25). 6. There is anterior cervical discectomy and fusion from C4 through C7 with additional bony ankylosis of C3-C4. 7. Extensive heterogeneous opacity of the partially included left pulmonary apex. Partially imaged right pleural effusion with scattered irregular and ground-glass opacity of the right pulmonary apex. Findings may reflect pulmonary contusion or multifocal airspace disease. Consider dedicated imaging of the chest. There is a probable nondisplaced fracture of the anterior left second rib (series 4, image  103). There is no displaced fracture or pneumothorax in the partially imaged portions of the chest. These results were called by telephone at the time of interpretation on 11/22/2018 at 11:33 a.m. to provider Dr. Vanita Panda, who verbally acknowledged these results. Electronically Signed   By: Eddie Candle M.D.   On: 12/08/2018 11:37   Dg Chest Portable 1 View  Result Date: 12/06/2018 CLINICAL DATA:  84 year old female with history of CPR. EXAM: PORTABLE CHEST 1 VIEW COMPARISON:  Chest x-ray 01/17/2016. FINDINGS: The patient is intubated, with the tip of the endotracheal tube at the level of the carina directed toward the right mainstem bronchus. A nasogastric tube is seen extending into the stomach, however, the tip of the nasogastric tube extends below the lower margin of the image. Defibrillation pads project over the upper left abdomen. Patchy multifocal airspace disease in the lungs bilaterally, most confluent in the left upper lobe where there are air bronchograms. No pleural effusions. No evidence of pulmonary edema. Heart size is normal. Right-sided breast implant incidentally noted. Orthopedic fixation hardware in the lower cervical spine incidentally noted. IMPRESSION: 1. Low lying endotracheal tube which should be retracted approximately 4.4 cm for more optimal placement at the level of the thoracic inlet. Per report from the technologist, the MD caring for the patient saw the low-lying tube and pulled the endotracheal tube back immediately after the examination. Attention on follow-up study is recommended. 2. Multilobar pneumonia, most severe in the left upper lobe. 3. Additional support apparatus, as above. Electronically Signed   By: Vinnie Langton M.D.   On: 11/17/2018  10:47   Ct Maxillofacial Wo Contrast  Result Date: 11/29/2018 CLINICAL DATA:  Head trauma EXAM: CT HEAD WITHOUT CONTRAST CT MAXILLOFACIAL WITHOUT CONTRAST CT CERVICAL SPINE WITHOUT CONTRAST TECHNIQUE: Multidetector CT imaging of  the head, cervical spine, and maxillofacial structures were performed using the standard protocol without intravenous contrast. Multiplanar CT image reconstructions of the cervical spine and maxillofacial structures were also generated. COMPARISON:  None. FINDINGS: CT HEAD FINDINGS Brain: No evidence of acute infarction, hemorrhage, hydrocephalus, extra-axial collection or mass lesion/mass effect. Mild periventricular white matter hypodensity. Vascular: No hyperdense vessel or unexpected calcification. CT FACIAL BONES FINDINGS Skull: Normal. Negative for fracture or focal lesion. Facial bones: Mildly displaced fractures of the nasal bones. Sinuses/Orbits: No acute finding. Other: Soft tissue laceration and hematoma of the forehead. Endotracheal and orogastric intubation. CT CERVICAL SPINE FINDINGS Alignment: Normal. Skull base and vertebrae: There are multipart, mildly displaced Jefferson type fractures of C1, with left and right-sided fractures of the anterior arch (series 10, image 10) and two fractures of the midline posterior and left aspect of the posterior arch (series 10, image 16). There is a mildly displaced transverse type II fracture of the dens (series 9, image 25). Soft tissues and spinal canal: No prevertebral fluid or swelling. No visible canal hematoma. Disc levels: There is anterior cervical discectomy and fusion from C4 through C7 with additional bony ankylosis of C3-C4. Upper chest: Extensive heterogeneous opacity of the partially included left pulmonary apex. Partially imaged right pleural effusion with scattered irregular and ground-glass opacity of the right pulmonary apex. Findings may reflect pulmonary contusion or multifocal airspace disease. There is a probable nondisplaced fracture of the anterior left second rib (series 4, image 103). Other: None. IMPRESSION: 1.  No acute intracranial pathology. 2.  Soft tissue laceration and hematoma of the forehead. 3.  Mildly displaced fractures of the  nasal bones. 4. There are multipart, mildly displaced Jefferson type fractures of C1, with left and right-sided fractures of the anterior arch (series 10, image 10) and two fractures of the midline posterior and left aspect of the posterior arch (series 10, image 16). 5. There is a mildly displaced transverse type II fracture of the dens (series 9, image 25). 6. There is anterior cervical discectomy and fusion from C4 through C7 with additional bony ankylosis of C3-C4. 7. Extensive heterogeneous opacity of the partially included left pulmonary apex. Partially imaged right pleural effusion with scattered irregular and ground-glass opacity of the right pulmonary apex. Findings may reflect pulmonary contusion or multifocal airspace disease. Consider dedicated imaging of the chest. There is a probable nondisplaced fracture of the anterior left second rib (series 4, image 103). There is no displaced fracture or pneumothorax in the partially imaged portions of the chest. These results were called by telephone at the time of interpretation on 11/18/2018 at 11:33 a.m. to provider Dr. Vanita Panda, who verbally acknowledged these results. Electronically Signed   By: Eddie Candle M.D.   On: 11/29/2018 11:37    Review of Systems  Unable to perform ROS: Critical illness   Blood pressure 124/65, pulse 74, resp. rate 16, height 5\' 6"  (1.676 m), SpO2 100 %. Physical Exam  Neurological: She is unresponsive. GCS eye subscore is 1. GCS verbal subscore is 1. GCS motor subscore is 1.  Patient nonresponsive    Assessment/Plan: 84 year old with a displaced C1-C2 fractures cervical collar only.  Unresponsive with minimal exam related to some level of anoxia.  Primary service having discussions with family regarding how aggressive tro be  Elleah Hemsley P 12/03/2018, 1:42 PM

## 2018-12-07 NOTE — ED Notes (Signed)
Family at bedside. 

## 2018-12-07 NOTE — ED Notes (Signed)
Pt packed with ice packs. CCM is at bedside working on central line & Art line at this time.

## 2018-12-07 NOTE — Consult Note (Signed)
Cardiology Consultation:   Patient ID: Colleen [name] MRN: [id number]; DOB: [birth year]  Admit date: 11/22/2018 Date of Consult: 11/30/2018  Primary Care Provider: Myer Peer, MD Primary Cardiologist: New Primary Electrophysiologist:  None    Patient Profile:   Colleen [name] is a 84 y.o. female with a hx of Alzheimer's disease, chronic pain syndrome, HTN, hypothyroidism and reported mitral valve disorder who is being seen today for the evaluation of out of hospital cardiac arrest, at the request of Dr. Ruthann Cancer, Critical Care Medicine.   History of Present Illness:   Colleen [name] is a 84 y/o female with Alzheimer's disease, chronic pain syndrome, HTN, hypothyroidism and mitral valve disorder (in [place] - no echo on file), brought in by EMS as an out of hospital cardiac arrest.   Based on chart review, no prior cardiac history other than reports of mitral valve disorder. She has late Alzheimer's disease followed by neurology at [facility]. She presented from home and apparently had an unwitnessed fall. Her husband was at home but in a different room and heard her fall in the room nearby. When he found her, she was face down in a pool of blood under her head and unresponsive. EMS was called. There was no bystander CPR prior to arrival. EMS found her pulseless and asystolic. ACLS protocol activated and ROSC achieved after 15 min of CPR.   In the ED, pt was w/o purposeful movement. She was intubated for airway protection. She was hypotensive w/ SBPs in the 60s and started on Levophed and epinephrine. Labs significant for Hs Troponin at 25, lactic acid at 4.3, pH 7.25. CXR with multilobar PNA, most severe in the left upper lobe.  Started on IV antibiotics. ED EKG showed SR w/ RBBB and PVCs. QT/QTc 484/504 ms. Initial K 4.2. Mg lab pending. CT of head and neck done in ED. No acute intracranial pathology. Soft tissue laceration and hematoma of the forehead noted, mildly displaced fractures of the nasal bones.  There are multipart, mildly displaced Jefferson type fractures of C1, with left and right-sided fractures of the anterior arch and two fractures of the midline posterior and left aspect of the posterior arch. There is a mildly displaced transverse type II fracture of the dens. Visualization of the upper chest also revealed probable nondisplaced fracture of the anterior left second rib. Pt placed in C-collar  Pt transferred to CCU and placed on hypothermia protocol. 2D echo pending.    Heart Pathway Score:     Past Medical History:  Diagnosis Date   Arthritis    Cancer ([place])    rt breast   Chronic back pain    Headache    Heart murmur    Hypertension    Hypothyroidism    Mitral valve disorder    Osteoporosis    Ulnar shaft fracture    right   Vitamin D deficiency     Past Surgical History:  Procedure Laterality Date   ABDOMINAL HYSTERECTOMY     APPENDECTOMY     BACK SURGERY     BIOPSY BREAST     left   BREAST RECONSTRUCTION WITH PLACEMENT OF TISSUE EXPANDER AND FLEX HD (ACELLULAR HYDRATED DERMIS)     right breast,  left  reduction    BREAST SURGERY     CARPAL TUNNEL RELEASE     right   CATARACT EXTRACTION W/ INTRAOCULAR LENS  IMPLANT, BILATERAL     CERVICAL FUSION     x2   FINGER MASS EXCISION  left little finger   JOINT REPLACEMENT Right 01-2015   KNEE ARTHROSCOPY     right   MASTECTOMY Right    ORIF ULNAR FRACTURE Right 06/27/2015   Procedure: OPEN REDUCTION INTERNAL FIXATION (ORIF) ULNAR FRACTURE;  Surgeon: [name], MD;  Location: [place];  Service: Orthopedics;  Laterality: Right;   TONSILLECTOMY     TOTAL KNEE ARTHROPLASTY Right 01/23/2015   Procedure: TOTAL KNEE ARTHROPLASTY;  Surgeon: [name], MD;  Location: [place];  Service: Orthopedics;  Laterality: Right;   TOTAL MASTECTOMY     right breast   VARICOSE VEIN SURGERY       Home Medications:  Prior to Admission medications   Medication Sig  Start Date End Date Taking? Authorizing Provider  furosemide (LASIX) 20 MG tablet Take 20 mg by mouth 2 (two) times daily.   Yes [provider]  HYDROcodone-acetaminophen (NORCO) 10-325 MG tablet Take 1 tablet by mouth every 6 (six) hours as needed for moderate pain.  12/20/17  Yes [provider]  BENICAR 20 MG tablet Take 1 tablet by mouth daily. 12/12/14   [provider]  bisacodyl (DULCOLAX) 5 MG EC tablet Take 1 tablet (5 mg total) by mouth daily as needed for moderate constipation. 01/23/15   Aundra Dubin, PA-C  cetirizine (ZYRTEC) 10 MG tablet Take 10 mg by mouth daily.    [provider]  cyclobenzaprine (FLEXERIL) 10 MG tablet Take 10 mg by mouth 3 (three) times daily as needed for muscle spasms.    [provider]  donepezil (ARICEPT) 10 MG tablet Take 10 mg by mouth daily. 12/01/18   [provider]  fluvoxaMINE (LUVOX) 100 MG tablet TAKE 1/2 TABLET(50 MG) BY MOUTH TWICE DAILY Patient taking differently: Take 50 mg by mouth 2 (two) times daily.  10/17/18   Thayer Headings, PMHNP  gabapentin (NEURONTIN) 300 MG capsule Take 1 capsule by mouth at bedtime. 10/12/14   [provider]  guaiFENesin (MUCINEX) 600 MG 12 hr tablet Take 600 mg by mouth as needed for cough or to loosen phlegm.     [provider]  levothyroxine (SYNTHROID, LEVOTHROID) 50 MCG tablet Take 1 tablet by mouth daily. 01/03/15   [provider]  LORazepam (ATIVAN) 0.5 MG tablet Take 1 tablet (0.5 mg total) by mouth every 6 (six) hours as needed for anxiety. 03/18/18 04/17/18  Thayer Headings, PMHNP  memantine (NAMENDA) 10 MG tablet  01/05/18   [provider]  nystatin cream (MYCOSTATIN) Apply 1 application topically at bedtime. 11/22/18   [provider]  OLANZapine (ZYPREXA) 5 MG tablet TAKE 1 TABLET(5 MG) BY MOUTH AT BEDTIME Patient taking differently: Take 5 mg by mouth at bedtime.  09/17/18   Thayer Headings, PMHNP  omeprazole  (PRILOSEC) 40 MG capsule Take 40 mg by mouth daily.  12/10/17   [provider]  Potassium Chloride ER 20 MEQ TBCR Take 20 mEq by mouth 2 (two) times daily.  03/29/17   [provider]  traZODone (DESYREL) 100 MG tablet Take 2 tablets by mouth at bedtime. 01/04/15   [provider]  Vitamin D, Ergocalciferol, (DRISDOL) 50000 units CAPS capsule Take 50,000 Units by mouth every 7 (seven) days.    [provider]    Inpatient Medications: Scheduled Meds:  artificial tears  1 application Both Eyes [id number]   aspirin  300 mg Rectal NOW   Chlorhexidine Gluconate Cloth  6 each Topical Daily   fentaNYL (SUBLIMAZE) injection  50 mcg Intravenous Once   heparin  5,000 Units Subcutaneous Q8H   [START ON 12/08/2018] levothyroxine  50 mcg Per Tube Daily   midazolam  1 mg Intravenous Once   nitroGLYCERIN  1 inch Topical Q8H   phentolamine  5 mg Subcutaneous Once   Continuous Infusions:  sodium chloride 50 mL/hr at 12/10/2018 1355   cefTRIAXone (ROCEPHIN)  IV     [START ON 12/08/2018] cefTRIAXone (ROCEPHIN)  IV     epinephrine Stopped (12/06/2018 1243)   famotidine (PEPCID) IV     fentaNYL infusion INTRAVENOUS     metronidazole     midazolam     norepinephrine (LEVOPHED) Adult infusion 10 mcg/min (11/23/2018 1356)   PRN Meds: fentaNYL, midazolam  Allergies:    Allergies  Allergen Reactions   Bee Venom Anaphylaxis, Hives, Itching and Swelling   Nsaids Hives   Nutritional Supplements Hives   Celebrex [Celecoxib] Other (See Comments)    unknown   Feldene [Piroxicam] Other (See Comments)    unknown   Latex Rash   Penicillins Rash    Received 2 GM Ancef with no obvious reaction    Social History:   Social History   Socioeconomic History   Marital status: Married    Spouse name: Not on file   Number of children: Not on file   Years of education: Not on file   Highest education level: Not on file  Occupational History   Not on file    Social Needs   Financial resource strain: Not on file   Food insecurity    Worry: Not on file    Inability: Not on file   Transportation needs    Medical: Not on file    Non-medical: Not on file  Tobacco Use   Smoking status: Never Smoker   Smokeless tobacco: Never Used  Substance and Sexual Activity   Alcohol use: No   Drug use: No   Sexual activity: Not on file  Lifestyle   Physical activity    Days per week: Not on file    Minutes per session: Not on file   Stress: Not on file  Relationships   Social connections    Talks on phone: Not on file    Gets together: Not on file    Attends religious service: Not on file    Active member of club or organization: Not on file    Attends meetings of clubs or organizations: Not on file    Relationship status: Not on file   Intimate partner violence    Fear of current or ex partner: Not on file    Emotionally abused: Not on file    Physically abused: Not on file    Forced sexual activity: Not on file  Other Topics Concern   Not on file  Social History Narrative   Not on file    Family History:    Family History  Problem Relation Age of Onset   Cancer Father    Depression Sister    Anxiety disorder Sister    Cancer Sister    Cancer Brother      ROS:  Please see the history of present illness.   All other ROS reviewed and negative.     Physical Exam/Data:   Vitals:   11/29/2018 1400 11/20/2018 1415 11/17/2018 1430 12/01/2018 1500  BP: (!) 114/57 112/67 113/63 119/63  Pulse: 70 74 76 81  Resp: 16 16 16 14   Temp: (!) 94.4 F (34.7 C) (!)  94.5 F (34.7 C) (!) 94.4 F (34.7 C) (!) 94.2 F (34.6 C)  SpO2: 100% 100% 100% 100%  Height:        Intake/Output Summary (Last 24 hours) at 12/06/2018 1554 Last data filed at 12/05/2018 1356 Gross per 24 hour  Intake 607.26 ml  Output --  Net 607.26 ml   Last 3 Weights 06/27/2015 06/26/2015 01/23/2015  Weight (lbs) 150 lb 155 lb 159 lb  Weight (kg) 68.04 kg  70.308 kg 72.122 kg  Some encounter information is confidential and restricted. Go to Review Flowsheets activity to see all data.     Body mass index is 28.08 kg/m.  General:  Critically ill elderly WF, intubated and sedated  HEENT: in c-collar Lymph: no adenopathy Neck: unable to access due to c-collar  Endocrine:  Unable to access thyroid due to c-collar Vascular: unable to access carotids, weak distal pulses bilaterally Cardiac:RRR. No murmur Lungs:  intubated, course BS Abd: obese and soft Ext: cool extremities, no edema Musculoskeletal:  No deformities, unable to test strength due to being sedated Skin: cool and clammy extremities   Neuro:  Sedated Psych:  sedated EKG:  The EKG was personally reviewed and demonstrates:  SR w/ RBBB 78 bpm QT/QTc 484/504 ms Telemetry:  Telemetry was personally reviewed and demonstrates: NSR 70s  Relevant CV Studies: 2D echo pending.   Laboratory Data:  High Sensitivity Troponin:   Recent Labs  Lab 12/10/2018 1031  TROPONINIHS 25*     Chemistry Recent Labs  Lab 11/30/2018 1031 11/27/2018 1059 12/03/2018 1207  NA 137 137 140  K 4.2 4.1 3.0*  CL 106 104  --   CO2 23  --   --   GLUCOSE 191* 185*  --   BUN 9 10  --   CREATININE 0.94 0.60  --   CALCIUM 8.1*  --   --   GFRNONAA 57*  --   --   GFRAA >60  --   --   ANIONGAP 8  --   --     No results for input(s): PROT, ALBUMIN, AST, ALT, ALKPHOS, BILITOT in the last 168 hours. Hematology Recent Labs  Lab 12/02/2018 1031 12/02/2018 1059 12/13/2018 1207  WBC 9.2  --   --   RBC 3.92  --   --   HGB 11.6* 12.2 12.9  HCT 36.8 36.0 38.0  MCV 93.9  --   --   MCH 29.6  --   --   MCHC 31.5  --   --   RDW 14.5  --   --   PLT 237  --   --    BNPNo results for input(s): BNP, PROBNP in the last 168 hours.  DDimer No results for input(s): DDIMER in the last 168 hours.   Radiology/Studies:  Ct Head Wo Contrast  Result Date: 11/17/2018 CLINICAL DATA:  Head trauma EXAM: CT HEAD WITHOUT CONTRAST  CT MAXILLOFACIAL WITHOUT CONTRAST CT CERVICAL SPINE WITHOUT CONTRAST TECHNIQUE: Multidetector CT imaging of the head, cervical spine, and maxillofacial structures were performed using the standard protocol without intravenous contrast. Multiplanar CT image reconstructions of the cervical spine and maxillofacial structures were also generated. COMPARISON:  None. FINDINGS: CT HEAD FINDINGS Brain: No evidence of acute infarction, hemorrhage, hydrocephalus, extra-axial collection or mass lesion/mass effect. Mild periventricular white matter hypodensity. Vascular: No hyperdense vessel or unexpected calcification. CT FACIAL BONES FINDINGS Skull: Normal. Negative for fracture or focal lesion. Facial bones: Mildly displaced fractures of the nasal bones. Sinuses/Orbits: No acute  finding. Other: Soft tissue laceration and hematoma of the forehead. Endotracheal and orogastric intubation. CT CERVICAL SPINE FINDINGS Alignment: Normal. Skull base and vertebrae: There are multipart, mildly displaced Jefferson type fractures of C1, with left and right-sided fractures of the anterior arch (series 10, image 10) and two fractures of the midline posterior and left aspect of the posterior arch (series 10, image 16). There is a mildly displaced transverse type II fracture of the dens (series 9, image 25). Soft tissues and spinal canal: No prevertebral fluid or swelling. No visible canal hematoma. Disc levels: There is anterior cervical discectomy and fusion from C4 through C7 with additional bony ankylosis of C3-C4. Upper chest: Extensive heterogeneous opacity of the partially included left pulmonary apex. Partially imaged right pleural effusion with scattered irregular and ground-glass opacity of the right pulmonary apex. Findings may reflect pulmonary contusion or multifocal airspace disease. There is a probable nondisplaced fracture of the anterior left second rib (series 4, image 103). Other: None. IMPRESSION: 1.  No acute  intracranial pathology. 2.  Soft tissue laceration and hematoma of the forehead. 3.  Mildly displaced fractures of the nasal bones. 4. There are multipart, mildly displaced Jefferson type fractures of C1, with left and right-sided fractures of the anterior arch (series 10, image 10) and two fractures of the midline posterior and left aspect of the posterior arch (series 10, image 16). 5. There is a mildly displaced transverse type II fracture of the dens (series 9, image 25). 6. There is anterior cervical discectomy and fusion from C4 through C7 with additional bony ankylosis of C3-C4. 7. Extensive heterogeneous opacity of the partially included left pulmonary apex. Partially imaged right pleural effusion with scattered irregular and ground-glass opacity of the right pulmonary apex. Findings may reflect pulmonary contusion or multifocal airspace disease. Consider dedicated imaging of the chest. There is a probable nondisplaced fracture of the anterior left second rib (series 4, image 103). There is no displaced fracture or pneumothorax in the partially imaged portions of the chest. These results were called by telephone at the time of interpretation on 12/06/2018 at 11:33 a.m. to provider Dr. Vanita Panda, who verbally acknowledged these results. Electronically Signed   By: Eddie Candle M.D.   On: 11/25/2018 11:37   Ct Angio Chest Pe W Or Wo Contrast  Result Date: 11/20/2018 CLINICAL DATA:  Cardiac arrest. Syncopal episode with fall this morning. EXAM: CT ANGIOGRAPHY CHEST WITH CONTRAST TECHNIQUE: Multidetector CT imaging of the chest was performed using the standard protocol during bolus administration of intravenous contrast. Multiplanar CT image reconstructions and MIPs were obtained to evaluate the vascular anatomy. CONTRAST:  39mL OMNIPAQUE IOHEXOL 350 MG/ML SOLN COMPARISON:  Chest x-ray dated 12/06/2018 FINDINGS: Cardiovascular: Satisfactory opacification of the pulmonary arteries to the segmental level. No  evidence of pulmonary embolism. Normal heart size. No pericardial effusion. Aortic atherosclerosis. Ascending thoracic aorta has a diameter of 3.8 cm. Mediastinum/Nodes: No enlarged mediastinal, hilar, or axillary lymph nodes. Thyroid gland, trachea, and esophagus demonstrate no significant findings. Endotracheal tube tip is 3 cm above the carina. NG tube is in the stomach. Lungs/Pleura: There are extensive consolidative infiltrates in the left upper and lower lobe with left prominent patchy infiltrates in the right upper and lower lobes with slight atelectasis in the right middle lobe. There is some poorly defined nodularity of the infiltrates in the upper lobes bilaterally. Tiny left effusion. Upper Abdomen: No acute abnormality. Musculoskeletal: No acute abnormality. Soft tissues: There is ill-defined increased density in the right supraclavicular  soft tissues suggestive of hemorrhage. Did the patient have attempts at central line insertion in this area? Review of the MIP images confirms the above findings. IMPRESSION: 1. No pulmonary emboli. 2. Extensive bilateral pulmonary infiltrates, left greater than right. 3. Ill-defined increased density in the right supraclavicular soft tissues suggestive of hemorrhage. Did the patient have attempts at central line insertion in this area. Aortic Atherosclerosis (ICD10-I70.0). Electronically Signed   By: Lorriane Shire M.D.   On: 11/20/2018 15:51   Ct Cervical Spine Wo Contrast  Result Date: 11/16/2018 CLINICAL DATA:  Head trauma EXAM: CT HEAD WITHOUT CONTRAST CT MAXILLOFACIAL WITHOUT CONTRAST CT CERVICAL SPINE WITHOUT CONTRAST TECHNIQUE: Multidetector CT imaging of the head, cervical spine, and maxillofacial structures were performed using the standard protocol without intravenous contrast. Multiplanar CT image reconstructions of the cervical spine and maxillofacial structures were also generated. COMPARISON:  None. FINDINGS: CT HEAD FINDINGS Brain: No evidence of  acute infarction, hemorrhage, hydrocephalus, extra-axial collection or mass lesion/mass effect. Mild periventricular white matter hypodensity. Vascular: No hyperdense vessel or unexpected calcification. CT FACIAL BONES FINDINGS Skull: Normal. Negative for fracture or focal lesion. Facial bones: Mildly displaced fractures of the nasal bones. Sinuses/Orbits: No acute finding. Other: Soft tissue laceration and hematoma of the forehead. Endotracheal and orogastric intubation. CT CERVICAL SPINE FINDINGS Alignment: Normal. Skull base and vertebrae: There are multipart, mildly displaced Jefferson type fractures of C1, with left and right-sided fractures of the anterior arch (series 10, image 10) and two fractures of the midline posterior and left aspect of the posterior arch (series 10, image 16). There is a mildly displaced transverse type II fracture of the dens (series 9, image 25). Soft tissues and spinal canal: No prevertebral fluid or swelling. No visible canal hematoma. Disc levels: There is anterior cervical discectomy and fusion from C4 through C7 with additional bony ankylosis of C3-C4. Upper chest: Extensive heterogeneous opacity of the partially included left pulmonary apex. Partially imaged right pleural effusion with scattered irregular and ground-glass opacity of the right pulmonary apex. Findings may reflect pulmonary contusion or multifocal airspace disease. There is a probable nondisplaced fracture of the anterior left second rib (series 4, image 103). Other: None. IMPRESSION: 1.  No acute intracranial pathology. 2.  Soft tissue laceration and hematoma of the forehead. 3.  Mildly displaced fractures of the nasal bones. 4. There are multipart, mildly displaced Jefferson type fractures of C1, with left and right-sided fractures of the anterior arch (series 10, image 10) and two fractures of the midline posterior and left aspect of the posterior arch (series 10, image 16). 5. There is a mildly displaced  transverse type II fracture of the dens (series 9, image 25). 6. There is anterior cervical discectomy and fusion from C4 through C7 with additional bony ankylosis of C3-C4. 7. Extensive heterogeneous opacity of the partially included left pulmonary apex. Partially imaged right pleural effusion with scattered irregular and ground-glass opacity of the right pulmonary apex. Findings may reflect pulmonary contusion or multifocal airspace disease. Consider dedicated imaging of the chest. There is a probable nondisplaced fracture of the anterior left second rib (series 4, image 103). There is no displaced fracture or pneumothorax in the partially imaged portions of the chest. These results were called by telephone at the time of interpretation on 11/30/2018 at 11:33 a.m. to provider Dr. Vanita Panda, who verbally acknowledged these results. Electronically Signed   By: Eddie Candle M.D.   On: 11/29/2018 11:37   Dg Chest Portable 1 View  Result Date:  11/25/2018 CLINICAL DATA:  84 year old female with history of CPR. EXAM: PORTABLE CHEST 1 VIEW COMPARISON:  Chest x-ray 01/17/2016. FINDINGS: The patient is intubated, with the tip of the endotracheal tube at the level of the carina directed toward the right mainstem bronchus. A nasogastric tube is seen extending into the stomach, however, the tip of the nasogastric tube extends below the lower margin of the image. Defibrillation pads project over the upper left abdomen. Patchy multifocal airspace disease in the lungs bilaterally, most confluent in the left upper lobe where there are air bronchograms. No pleural effusions. No evidence of pulmonary edema. Heart size is normal. Right-sided breast implant incidentally noted. Orthopedic fixation hardware in the lower cervical spine incidentally noted. IMPRESSION: 1. Low lying endotracheal tube which should be retracted approximately 4.4 cm for more optimal placement at the level of the thoracic inlet. Per report from the  technologist, the MD caring for the patient saw the low-lying tube and pulled the endotracheal tube back immediately after the examination. Attention on follow-up study is recommended. 2. Multilobar pneumonia, most severe in the left upper lobe. 3. Additional support apparatus, as above. Electronically Signed   By: Vinnie Langton M.D.   On: 12/06/2018 10:47   Ct Maxillofacial Wo Contrast  Result Date: 12/10/2018 CLINICAL DATA:  Head trauma EXAM: CT HEAD WITHOUT CONTRAST CT MAXILLOFACIAL WITHOUT CONTRAST CT CERVICAL SPINE WITHOUT CONTRAST TECHNIQUE: Multidetector CT imaging of the head, cervical spine, and maxillofacial structures were performed using the standard protocol without intravenous contrast. Multiplanar CT image reconstructions of the cervical spine and maxillofacial structures were also generated. COMPARISON:  None. FINDINGS: CT HEAD FINDINGS Brain: No evidence of acute infarction, hemorrhage, hydrocephalus, extra-axial collection or mass lesion/mass effect. Mild periventricular white matter hypodensity. Vascular: No hyperdense vessel or unexpected calcification. CT FACIAL BONES FINDINGS Skull: Normal. Negative for fracture or focal lesion. Facial bones: Mildly displaced fractures of the nasal bones. Sinuses/Orbits: No acute finding. Other: Soft tissue laceration and hematoma of the forehead. Endotracheal and orogastric intubation. CT CERVICAL SPINE FINDINGS Alignment: Normal. Skull base and vertebrae: There are multipart, mildly displaced Jefferson type fractures of C1, with left and right-sided fractures of the anterior arch (series 10, image 10) and two fractures of the midline posterior and left aspect of the posterior arch (series 10, image 16). There is a mildly displaced transverse type II fracture of the dens (series 9, image 25). Soft tissues and spinal canal: No prevertebral fluid or swelling. No visible canal hematoma. Disc levels: There is anterior cervical discectomy and fusion from C4  through C7 with additional bony ankylosis of C3-C4. Upper chest: Extensive heterogeneous opacity of the partially included left pulmonary apex. Partially imaged right pleural effusion with scattered irregular and ground-glass opacity of the right pulmonary apex. Findings may reflect pulmonary contusion or multifocal airspace disease. There is a probable nondisplaced fracture of the anterior left second rib (series 4, image 103). Other: None. IMPRESSION: 1.  No acute intracranial pathology. 2.  Soft tissue laceration and hematoma of the forehead. 3.  Mildly displaced fractures of the nasal bones. 4. There are multipart, mildly displaced Jefferson type fractures of C1, with left and right-sided fractures of the anterior arch (series 10, image 10) and two fractures of the midline posterior and left aspect of the posterior arch (series 10, image 16). 5. There is a mildly displaced transverse type II fracture of the dens (series 9, image 25). 6. There is anterior cervical discectomy and fusion from C4 through C7 with additional bony  ankylosis of C3-C4. 7. Extensive heterogeneous opacity of the partially included left pulmonary apex. Partially imaged right pleural effusion with scattered irregular and ground-glass opacity of the right pulmonary apex. Findings may reflect pulmonary contusion or multifocal airspace disease. Consider dedicated imaging of the chest. There is a probable nondisplaced fracture of the anterior left second rib (series 4, image 103). There is no displaced fracture or pneumothorax in the partially imaged portions of the chest. These results were called by telephone at the time of interpretation on 11/16/2018 at 11:33 a.m. to provider Dr. Vanita Panda, who verbally acknowledged these results. Electronically Signed   By: Eddie Candle M.D.   On: 12/14/2018 11:37    Assessment and Plan:   1. Out of Hospital Arrest/ Shock: no bystander CPR prior to EMS arrival. Found to be in asystole. ROSC achieved  after 15 min of EMS CPR. ED EKG showed SR w/ RBBB, QT/QTc 484/504 ms. No prior EKGs for comparison. K 4.2 on admit. Mg pending. Hs Troponin 25. Intubated and sedated. In CCU.   On Levophed and Epi. Keep MAPs > 65  Vent management per PCCM  Hypothermia protocol per PCCM  Bilateral infiltrates on CXR - COVID negative. Antibiotics per primary team  W/u pending to r/o PE  prolonged QT>> hold psych meds   Check Mg  Keep K >4.0 and Mg >2.0  2d echo pending   No IV heparin given head trama   Further cardiac work-up/ischemic evaluation dependent on neurological recovery   Currently full CODE. Given advanced aged and Alzheimer's disease, need to discuss goals of care w/ family    For questions or updates, please contact [facility] Please consult [email or web address] for contact info under     Signed, Lyda Jester, PA-C  11/18/2018 3:54 PM

## 2018-12-07 NOTE — Procedures (Signed)
Arterial Catheter Insertion Procedure Note Colleen [name] [id number] Jan 06, 1935  Procedure: Insertion of Arterial Catheter  Indications: Blood pressure monitoring and Frequent blood sampling  Procedure Details Consent: Unable to obtain consent because of emergent medical necessity. Time Out: Verified patient identification, verified procedure, site/side was marked, verified correct patient position, special equipment/implants available, medications/allergies/relevent history reviewed, required imaging and test results available.  Performed  Maximum sterile technique was used including antiseptics, cap, gloves, gown, hand hygiene, mask and sheet. Skin prep: Chlorhexidine; local anesthetic administered 20 gauge catheter was inserted into left radial artery using the Seldinger technique. ULTRASOUND GUIDANCE USED: NO Evaluation Blood flow good; BP tracing good. Complications: No apparent complications.   [name] 11/25/2018

## 2018-12-07 NOTE — ED Notes (Signed)
Pt had 4 second run of tachycardia in the 140s-150s, rhythm strip ran and given to dr lockwood.

## 2018-12-07 NOTE — CV Procedure (Signed)
2D echo attempted but could not get to patient. RN said try later

## 2018-12-07 NOTE — ED Notes (Signed)
Report has been called however can't go up stairs until CCM is done with lines

## 2018-12-07 NOTE — Progress Notes (Signed)
EEG complete - results pending 

## 2018-12-07 NOTE — Procedures (Signed)
Patient Name: Colleen [name]  MRN: [id number]  Epilepsy Attending: Lora Havens  Referring Physician/Provider: Dr Audria Nine Date: 12/10/2018 Duration: 21.48 mins  Patient history: 84yo F with cardiac arrest on hypothermia protocol. EEG to evaluate for seizure.   Level of alertness: comatose  AEDs during EEG study: versed  Technical aspects: This EEG study was done with scalp electrodes positioned according to the 10-20 International system of electrode placement. Electrical activity was acquired at a sampling rate of 500Hz  and reviewed with a high frequency filter of 70Hz  and a low frequency filter of 1Hz . EEG data were recorded continuously and digitally stored.   DESCRIPTION: EEG showed generalized suppression. EEG was not reactive to tactile stimulation. Hyperventilation and photic stimulation were not performed.  ABNORMALITY: - Suppression, generalized  IMPRESSION: This study is suggestive of profound encephalopathy likely secondary to cardiac arrest and subsequent diffuse anoxic/hypoxic brain injury.  No seizures or epileptiform discharges were seen throughout the recording.    [name] [name]

## 2018-12-07 NOTE — ED Notes (Signed)
Per Vanita Panda c collar changed out to an aspen with the help of [employer] holding C spine.

## 2018-12-07 NOTE — H&P (Signed)
NAME:  Colleen [name], MRN:  [id number], DOB:  1935/02/01, LOS: 0 ADMISSION DATE:  11/18/2018, CONSULTATION DATE:  9/23 REFERRING MD:  Vanita Panda, CHIEF COMPLAINT:  Cardiac arrest   Brief History   84 y/o F who presented to [facility] on 9/23 after an unwitnessed fall at home, found to be in asystole requiring 15 min of EMS CPR prior to [place].  Intubated on arrival, imaging on admission with C1 and C2 fractures, & significant left sided pulmonary infiltrates.   History of present illness   84 yo F who presented to [facility] on 9/23 after an unwitnessed fall at home, found by husband face down with pool of blood under her head. On arrival EMS found patient pulseless in asystole. The patient required 15 min of CPR prior to ROSC achieved.    Patients daughter states that patient was at home with husband when she attempted to ambulate to the restroom at which time she suffered the fall. Patient fell forward striking her head with no signs of attempts to prevent fall. Daughter stated that patients husband did not witness the fall. Daughter reported patient has been suffering from multiple falls recently, last major fall was spring of this year that resulted in fracture to left small finger.   Initial ER evaluation notable for hypotension, tachycardia and hypothermia. Labwork on admission with elevated troponin and lactic acid. ABG 7.25 / 42 / 150 / 19.  Imaging on admission including CT cervical spine, head, and maxillfacial revealed C1 multipart Jefferson type fracture, mildly displaced transverse type II fracture to C2. The patient was intubated in the ER for airway protection.    PCCM called for ICU admission.   Past Medical History  Hypertension Hypothyroidism Heart Murmur  Right breast cancer Mitral valve disorder Osteoporosis   Significant Hospital Events   9/23 Admitted post 15 min CPR, TTM initiated  Consults:  Neurosurgery   Procedures:  Lleft femoral central line 9/23 >>  Significant Diagnostic  Tests:  9/23  CT Cervical Spine, Head, and Maxillfacial revealed C1 multipart Jefferson type fracture, mildly displaced transverse type II fracture to C2, multiple nasal fractures, pulmonary contusions   Micro Data:  COVID 9/23 >> neg BCx 9/23 >>  Antimicrobials:  Azithromycin 9/23 x1 dose Ceftriaxone 9/23 >> Flagyl 9/23 >>  Interim history/subjective:  Mechanically intubated on full vent support, dry dressing to lacerations to forehead and nasal bone.   Objective   Blood pressure 135/66, pulse 80, resp. rate 16, height 5\' 6"  (1.676 m), SpO2 100 %.    Vent Mode: PRVC FiO2 (%):  [70 %-100 %] 70 % Set Rate:  [14 bmp-16 bmp] 16 bmp Vt Set:  [470 mL] 470 mL PEEP:  [5 cmH20] 5 cmH20 Plateau Pressure:  [20 cmH20] 20 cmH20   Intake/Output Summary (Last 24 hours) at 11/20/2018 1317 Last data filed at 11/22/2018 1243 Gross per 24 hour  Intake 216.88 ml  Output -  Net 216.88 ml   There were no vitals filed for this visit.  Examination: General: Elderly female on mechanical ventilation, traumatic injury seen to forehead and nose HEENT: ETT, MM pink/moist with blood tinged secretions seen, pupils pinpoint unreactive  Neuro: unresponsive on vent, no plantar reflex CV: s1s2 regular rate and rhythm, no murmur, rubs, or gallops, frequent PVC's/PAC's on monitor PULM:  Course breath sounds bilaterally, compliant on vent GI: soft, bowel sounds active in all 4 quadrants Extremities: warm/dry, no edema  Skin: no rashes or lesions  Resolved Hospital Problem list  Assessment & Plan:   Cardiac arrest -unclear etiology, new RBBB, r/o PE, ? Electrical disturbance with multiple PSY medications -concern for possible anoxic injury  Circulatory shock -approx 30 minutes downtime, 15 min before CPR initiated  P: Target temperature management goal 33C Vasopressors for MAP goal 80 Neuromuscular blockade to prevent shivering RASS goal -5 Obtain CT angio chest with new RBBB, collapse   Repeat ABG and CRX in am Follow up EKG in am  Await re-warming for neuro prognostication   Acute Hypoxic Respiratory failure Left Upper Lobe Infiltrates, Suspected Aspiration -Post cardiac arrest, LUL pneumonia possible secondary to aspiration P: PRVC Vt 470, 8cc/kg VAP protocol in place  Wean PEEP and FiO2 for sats greater than 92% Empiric IV antibiotics  Follow cultures  Traumatic fracture C1 and C2 Traumatic multiple nasal bone fractures  -Seen on CT imagine on admission P: Maintain ridge C-collar Neurosurgery consulted, appreciate input  Ensure adequate pain control   New RBBB -RBBB seen on admission with ectopy including frequent PVC. Troponin 25 on admission  P: Continuous telemetry  Consult cardiology, appreciate input Assess CTA to rule out PE Trend troponin  Obtain ECHO   At Risk AKI P: Trend BMP / UOP  Replace electrolytes as indicated  Follow up BMP at 1800 to assess K Avoid nephrotoxic agents   Hypothyroidism  P: Obtain TSH Resume synthroid for am 9/24  Dementia Chronic Back Pain  P: Hold home namenda, aricept, zyprexa, fluvoxamine, neurontin  Hx HTN P: Hold home lasix, benicar  Best practice:  Diet: NPO, consider TF in am 9/24 Pain/Anxiety/Delirium protocol (if indicated): Fentanyl and Versed drip  VAP protocol (if indicated): In place  DVT prophylaxis: heparin  GI prophylaxis: pepcid  Glucose control: N/A Mobility: Bedrest  Code Status: Full code Family Communication: Updated extensively at bedside with decision to continue full code status with aggressive care now. If patient dose not respond to Hypothermia protocol family would not want any further aggressive measures including trach or PEG tube.   Disposition: Cardiovascular ICU  Labs   CBC: Recent Labs  Lab 11/18/2018 1031 11/24/2018 1059 12/02/2018 1207  WBC 9.2  --   --   HGB 11.6* 12.2 12.9  HCT 36.8 36.0 38.0  MCV 93.9  --   --   PLT 237  --   --     Basic Metabolic Panel:  Recent Labs  Lab 11/26/2018 1031 11/18/2018 1059 11/25/2018 1207  NA 137 137 140  K 4.2 4.1 3.0*  CL 106 104  --   CO2 23  --   --   GLUCOSE 191* 185*  --   BUN 9 10  --   CREATININE 0.94 0.60  --   CALCIUM 8.1*  --   --    GFR: CrCl cannot be calculated (Unknown ideal weight.). Recent Labs  Lab 12/04/2018 1031  WBC 9.2  LATICACIDVEN 4.3*    Liver Function Tests: No results for input(s): AST, ALT, ALKPHOS, BILITOT, PROT, ALBUMIN in the last 168 hours. No results for input(s): LIPASE, AMYLASE in the last 168 hours. No results for input(s): AMMONIA in the last 168 hours.  ABG    Component Value Date/Time   PHART 7.252 (L) 12/01/2018 1207   PCO2ART 42.9 12/10/2018 1207   PO2ART 150.0 (H) 12/02/2018 1207   HCO3 19.0 (L) 12/01/2018 1207   TCO2 20 (L) 12/03/2018 1207   ACIDBASEDEF 8.0 (H) 12/12/2018 1207   O2SAT 99.0 11/22/2018 1207     Coagulation Profile: Recent Labs  Lab 12/02/2018 1031  INR 1.1    Cardiac Enzymes: No results for input(s): CKTOTAL, CKMB, CKMBINDEX, TROPONINI in the last 168 hours.  HbA1C: No results found for: HGBA1C  CBG: No results for input(s): GLUCAP in the last 168 hours.  Review of Systems:   Unable to obtain due to patients condition including mechanical ventilation   Past Medical History  She,  has a past medical history of Arthritis, Cancer ([place]), Chronic back pain, Headache, Heart murmur, Hypertension, Hypothyroidism, Mitral valve disorder, Osteoporosis, Ulnar shaft fracture, and Vitamin D deficiency.   Surgical History    Past Surgical History:  Procedure Laterality Date  . ABDOMINAL HYSTERECTOMY    . APPENDECTOMY    . BACK SURGERY    . BIOPSY BREAST     left  . BREAST RECONSTRUCTION WITH PLACEMENT OF TISSUE EXPANDER AND FLEX HD (ACELLULAR HYDRATED DERMIS)     right breast,  left  reduction   . BREAST SURGERY    . CARPAL TUNNEL RELEASE     right  . CATARACT EXTRACTION W/ INTRAOCULAR LENS  IMPLANT, BILATERAL    . CERVICAL  FUSION     x2  . FINGER MASS EXCISION     left little finger  . JOINT REPLACEMENT Right 01-2015  . KNEE ARTHROSCOPY     right  . MASTECTOMY Right   . ORIF ULNAR FRACTURE Right 06/27/2015   Procedure: OPEN REDUCTION INTERNAL FIXATION (ORIF) ULNAR FRACTURE;  Surgeon: [name], MD;  Location: [place];  Service: Orthopedics;  Laterality: Right;  . TONSILLECTOMY    . TOTAL KNEE ARTHROPLASTY Right 01/23/2015   Procedure: TOTAL KNEE ARTHROPLASTY;  Surgeon: [name], MD;  Location: [place];  Service: Orthopedics;  Laterality: Right;  . TOTAL MASTECTOMY     right breast  . VARICOSE VEIN SURGERY       Social History   reports that she has never smoked. She has never used smokeless tobacco. She reports that she does not drink alcohol or use drugs.   Family History   Her family history includes Anxiety disorder in her sister; Cancer in her brother, father, and sister; Depression in her sister.   Allergies Allergies  Allergen Reactions  . Bee Venom Anaphylaxis, Hives, Itching and Swelling  . Nsaids Hives  . Nutritional Supplements Hives  . Celebrex [Celecoxib] Other (See Comments)    unknown  . Feldene [Piroxicam] Other (See Comments)    unknown  . Latex Rash  . Penicillins Rash    Received 2 GM Ancef with no obvious reaction     Home Medications  Prior to Admission medications   Medication Sig Start Date End Date Taking? Authorizing Provider  furosemide (LASIX) 20 MG tablet Take 20 mg by mouth 2 (two) times daily.   Yes [provider]  HYDROcodone-acetaminophen (NORCO) 10-325 MG tablet Take 1 tablet by mouth every 6 (six) hours as needed for moderate pain.  12/20/17  Yes [provider]  BENICAR 20 MG tablet Take 1 tablet by mouth daily. 12/12/14   [provider]  bisacodyl (DULCOLAX) 5 MG EC tablet Take 1 tablet (5 mg total) by mouth daily as needed for moderate constipation. 01/23/15   Aundra Dubin, PA-C  cetirizine  (ZYRTEC) 10 MG tablet Take 10 mg by mouth daily.    [provider]  cyclobenzaprine (FLEXERIL) 10 MG tablet Take 10 mg by mouth 3 (three) times daily as needed for muscle spasms.    [provider]  donepezil (ARICEPT) 10 MG tablet Take 10 mg by mouth daily. 12/01/18   [provider]  fluvoxaMINE (LUVOX) 100 MG tablet TAKE 1/2 TABLET(50 MG) BY MOUTH TWICE DAILY Patient taking differently: Take 50 mg by mouth 2 (two) times daily.  10/17/18   Thayer Headings, PMHNP  gabapentin (NEURONTIN) 300 MG capsule Take 1 capsule by mouth at bedtime. 10/12/14   [provider]  guaiFENesin (MUCINEX) 600 MG 12 hr tablet Take 600 mg by mouth as needed for cough or to loosen phlegm.     [provider]  levothyroxine (SYNTHROID, LEVOTHROID) 50 MCG tablet Take 1 tablet by mouth daily. 01/03/15   [provider]  LORazepam (ATIVAN) 0.5 MG tablet Take 1 tablet (0.5 mg total) by mouth every 6 (six) hours as needed for anxiety. 03/18/18 04/17/18  Thayer Headings, PMHNP  memantine (NAMENDA) 10 MG tablet  01/05/18   [provider]  nystatin cream (MYCOSTATIN) Apply 1 application topically at bedtime. 11/22/18   [provider]  OLANZapine (ZYPREXA) 5 MG tablet TAKE 1 TABLET(5 MG) BY MOUTH AT BEDTIME Patient taking differently: Take 5 mg by mouth at bedtime.  09/17/18   Thayer Headings, PMHNP  omeprazole (PRILOSEC) 40 MG capsule Take 40 mg by mouth daily.  12/10/17   [provider]  Potassium Chloride ER 20 MEQ TBCR Take 20 mEq by mouth 2 (two) times daily.  03/29/17   [provider]  traZODone (DESYREL) 100 MG tablet Take 2 tablets by mouth at bedtime. 01/04/15   [provider]  Vitamin D, Ergocalciferol, (DRISDOL) 50000 units CAPS capsule Take 50,000 Units by mouth every 7 (seven) days.    [provider]     Critical care time:      CRITICAL CARE Performed by: Johnsie Cancel   Total critical care time: 50  minutes  Critical care time was exclusive of separately billable procedures and treating other patients.  Critical care was necessary to treat or prevent imminent or life-threatening deterioration.  Critical care was time spent personally by me on the following activities: development of treatment plan with patient and/or surrogate as well as nursing, discussions with consultants, evaluation of patient's response to treatment, examination of patient, obtaining history from patient or surrogate, ordering and performing treatments and interventions, ordering and review of laboratory studies, ordering and review of radiographic studies, pulse oximetry and re-evaluation of patient's condition.   Johnsie Cancel, NP-C [facility] Pgr: [phone] or if no answer [phone] 12/10/2018, 2:13 PM

## 2018-12-07 NOTE — ED Notes (Signed)
Help get patient undress on the monitor did ekg shown to Dr Vanita Panda

## 2018-12-07 NOTE — ED Provider Notes (Signed)
[facility] EMERGENCY DEPARTMENT Provider Note   CSN: [id number] Arrival date & time: 11/26/2018  1013     History   Chief Complaint Chief Complaint  Patient presents with   Post-CPR    HPI Colleen [name] is a 84 y.o. female.     HPI   Patient presents after a witnessed fall with unresponsiveness. She arrives via EMS. EMS providers provide HPI, patient is unresponsive, level 5 caveat. Per report the patient was walking, when her husband witnessed her fall forward. Subsequently, the patient had bleeding from facial wounds, but was unresponsive, prompting EMS notification. EMS notes that on their arrival the patient was apneic, and soon thereafter unresponsive, with initial cardiac monitoring demonstrating asystole. Patient received approximately 10 minutes of CPR, had King airway placed, had return to spontaneous circulation, but no interactivity. She was brought here for evaluation.   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   Anxiety 12/30/2017   DJD (degenerative joint disease) of knee 01/23/2015    Past Surgical History:  Procedure Laterality Date   ABDOMINAL HYSTERECTOMY     APPENDECTOMY     BACK SURGERY     BIOPSY BREAST     left   BREAST RECONSTRUCTION WITH PLACEMENT OF TISSUE EXPANDER AND FLEX HD (ACELLULAR HYDRATED DERMIS)     right breast,  left  reduction    BREAST SURGERY     CARPAL TUNNEL RELEASE     right   CATARACT EXTRACTION W/ INTRAOCULAR LENS  IMPLANT, BILATERAL     CERVICAL FUSION     x2   FINGER MASS EXCISION     left little finger   JOINT REPLACEMENT Right 01-2015   KNEE  ARTHROSCOPY     right   MASTECTOMY Right    ORIF ULNAR FRACTURE Right 06/27/2015   Procedure: OPEN REDUCTION INTERNAL FIXATION (ORIF) ULNAR FRACTURE;  Surgeon: [name], MD;  Location: [place];  Service: Orthopedics;  Laterality: Right;   TONSILLECTOMY     TOTAL KNEE ARTHROPLASTY Right 01/23/2015   Procedure: TOTAL KNEE ARTHROPLASTY;  Surgeon: [name], MD;  Location: [place];  Service: Orthopedics;  Laterality: Right;   TOTAL MASTECTOMY     right breast   VARICOSE VEIN SURGERY       OB History   No obstetric history on file.      Home Medications    Prior to Admission medications   Medication Sig Start Date End Date Taking? Authorizing Provider  BENICAR 20 MG tablet Take 1 tablet by mouth daily. 12/12/14   [provider]  bisacodyl (DULCOLAX) 5 MG EC tablet Take 1 tablet (5 mg total) by mouth daily as needed for moderate constipation. 01/23/15   Aundra Dubin, PA-C  cetirizine (ZYRTEC) 10 MG tablet Take 10 mg by mouth daily.    [provider]  cyclobenzaprine (FLEXERIL) 10 MG tablet Take 10 mg by mouth 3 (three) times daily as needed for muscle spasms.    [provider]  fluvoxaMINE (LUVOX) 100 MG tablet  TAKE 1/2 TABLET(50 MG) BY MOUTH TWICE DAILY 10/17/18   Thayer Headings, PMHNP  gabapentin (NEURONTIN) 300 MG capsule Take 1 capsule by mouth at bedtime. 10/12/14   [provider]  guaiFENesin (MUCINEX) 600 MG 12 hr tablet Take 600 mg by mouth as needed (1 tablet).    [provider]  HYDROcodone-acetaminophen (NORCO) 10-325 MG tablet Take 1 tablet by mouth every 6 (six) hours as needed. 12/20/17   [provider]  levothyroxine (SYNTHROID, LEVOTHROID) 50 MCG tablet Take 1 tablet by mouth daily. 01/03/15   [provider]  LORazepam (ATIVAN) 0.5 MG tablet Take 1 tablet (0.5 mg total) by mouth every 6 (six) hours as needed for anxiety. 03/18/18 04/17/18  Thayer Headings, PMHNP  memantine  (NAMENDA) 10 MG tablet  01/05/18   [provider]  OLANZapine (ZYPREXA) 5 MG tablet TAKE 1 TABLET(5 MG) BY MOUTH AT BEDTIME 09/17/18   Thayer Headings, PMHNP  omeprazole (PRILOSEC) 40 MG capsule TAKE 1 CAPSULE BY MOUTH ONCE DAILY BEFORE MEAL(S) 12/10/17   [provider]  ondansetron (ZOFRAN) 4 MG tablet Take 1 tablet (4 mg total) by mouth every 8 (eight) hours as needed for nausea or vomiting. Patient not taking: Reported on 01/10/2018 06/27/15   Aundra Dubin, PA-C  oxyCODONE-acetaminophen (ROXICET) 5-325 MG tablet Take 1-2 tablets by mouth every 4 (four) hours as needed. Patient not taking: Reported on 01/10/2018 06/27/15   Aundra Dubin, PA-C  Potassium Chloride ER 20 MEQ TBCR TK 1 T PO BID 03/29/17   [provider]  traZODone (DESYREL) 100 MG tablet Take 2 tablets by mouth at bedtime. 01/04/15   [provider]  Vitamin D, Ergocalciferol, (DRISDOL) 50000 units CAPS capsule Take 50,000 Units by mouth every 7 (seven) days.    [provider]    Family History Family History  Problem Relation Age of Onset   Cancer Father    Depression Sister    Anxiety disorder Sister    Cancer Sister    Cancer Brother     Social History Social History   Tobacco Use   Smoking status: Never Smoker   Smokeless tobacco: Never Used  Substance Use Topics   Alcohol use: No   Drug use: No     Allergies   Anti-inflammatory enzyme [nutritional supplements], Celebrex [celecoxib], Feldene [piroxicam], Latex, and Penicillins   Review of Systems Review of Systems  Unable to perform ROS: Acuity of condition     Physical Exam Updated Vital Signs BP (!) 172/98    Pulse (!) 120    Resp 11    Ht 5\' 6"  (1.676 m)    SpO2 99%    BMI 28.08 kg/m   Physical Exam Vitals signs and nursing note reviewed.  Constitutional:      Appearance: She is well-developed. She is ill-appearing.     Comments: Ill-appearing elderly female unresponsive  HENT:      Head: Normocephalic.   Eyes:     Conjunctiva/sclera: Conjunctivae normal.  Cardiovascular:     Rate and Rhythm: Normal rate and regular rhythm.     Comments: Patient has appreciable pulses, right femoral Pulmonary:     Breath sounds: Normal breath sounds.     Comments: Patient ventilates without difficulty with mechanical bag ventilation Chest:    Abdominal:     General: There is no distension.  Skin:    General: Skin is warm and dry.  Neurological:     Comments: Flaccid, areflexic in the lower extremities, pinpoint  pupils bilaterally  Psychiatric:        Cognition and Memory: Cognition is impaired.      ED Treatments / Results  Labs (all labs ordered are listed, but only abnormal results are displayed) Labs Reviewed  BASIC METABOLIC PANEL - Abnormal; Notable for the following components:      Result Value   Glucose, Bld 191 (*)    Calcium 8.1 (*)    GFR calc non Af Amer 57 (*)    All other components within normal limits  CBC - Abnormal; Notable for the following components:   Hemoglobin 11.6 (*)    All other components within normal limits  LACTIC ACID, PLASMA - Abnormal; Notable for the following components:   Lactic Acid, Venous 4.3 (*)    All other components within normal limits  LACTIC ACID, PLASMA - Abnormal; Notable for the following components:   Lactic Acid, Venous 3.8 (*)    All other components within normal limits  GLUCOSE, CAPILLARY - Abnormal; Notable for the following components:   Glucose-Capillary 192 (*)    All other components within normal limits  CBC - Abnormal; Notable for the following components:   WBC 22.6 (*)    All other components within normal limits  BASIC METABOLIC PANEL - Abnormal; Notable for the following components:   Chloride 112 (*)    CO2 15 (*)    Glucose, Bld 217 (*)    Calcium 7.5 (*)    All other components within normal limits  GLUCOSE, CAPILLARY - Abnormal; Notable for the following components:   Glucose-Capillary  230 (*)    All other components within normal limits  GLUCOSE, CAPILLARY - Abnormal; Notable for the following components:   Glucose-Capillary 147 (*)    All other components within normal limits  GLUCOSE, CAPILLARY - Abnormal; Notable for the following components:   Glucose-Capillary 212 (*)    All other components within normal limits  GLUCOSE, CAPILLARY - Abnormal; Notable for the following components:   Glucose-Capillary 199 (*)    All other components within normal limits  GLUCOSE, CAPILLARY - Abnormal; Notable for the following components:   Glucose-Capillary 194 (*)    All other components within normal limits  GLUCOSE, CAPILLARY - Abnormal; Notable for the following components:   Glucose-Capillary 183 (*)    All other components within normal limits  BASIC METABOLIC PANEL - Abnormal; Notable for the following components:   Chloride 113 (*)    CO2 16 (*)    Glucose, Bld 160 (*)    Calcium 7.6 (*)    All other components within normal limits  GLUCOSE, CAPILLARY - Abnormal; Notable for the following components:   Glucose-Capillary 207 (*)    All other components within normal limits  GLUCOSE, CAPILLARY - Abnormal; Notable for the following components:   Glucose-Capillary 207 (*)    All other components within normal limits  GLUCOSE, CAPILLARY - Abnormal; Notable for the following components:   Glucose-Capillary 176 (*)    All other components within normal limits  GLUCOSE, CAPILLARY - Abnormal; Notable for the following components:   Glucose-Capillary 179 (*)    All other components within normal limits  GLUCOSE, CAPILLARY - Abnormal; Notable for the following components:   Glucose-Capillary 189 (*)    All other components within normal limits  GLUCOSE, CAPILLARY - Abnormal; Notable for the following components:   Glucose-Capillary 168 (*)    All other components within normal limits  GLUCOSE, CAPILLARY - Abnormal; Notable for the following  components:    Glucose-Capillary 171 (*)    All other components within normal limits  I-STAT CHEM 8, ED - Abnormal; Notable for the following components:   Glucose, Bld 185 (*)    All other components within normal limits  POCT I-STAT 7, (LYTES, BLD GAS, ICA,H+H) - Abnormal; Notable for the following components:   pH, Arterial 7.252 (*)    pO2, Arterial 150.0 (*)    Bicarbonate 19.0 (*)    TCO2 20 (*)    Acid-base deficit 8.0 (*)    Potassium 3.0 (*)    Calcium, Ion 1.13 (*)    All other components within normal limits  POCT I-STAT, CHEM 8 - Abnormal; Notable for the following components:   Chloride 112 (*)    Creatinine, Ser 0.40 (*)    Glucose, Bld 226 (*)    Calcium, Ion 1.03 (*)    TCO2 16 (*)    All other components within normal limits  POCT I-STAT 4, (NA,K, GLUC, HGB,HCT) - Abnormal; Notable for the following components:   Potassium 5.3 (*)    Glucose, Bld 216 (*)    All other components within normal limits  POCT I-STAT, CHEM 8 - Abnormal; Notable for the following components:   Creatinine, Ser 0.40 (*)    Glucose, Bld 215 (*)    Calcium, Ion 1.12 (*)    TCO2 16 (*)    All other components within normal limits  POCT I-STAT 7, (LYTES, BLD GAS, ICA,H+H) - Abnormal; Notable for the following components:   pH, Arterial 7.331 (*)    pCO2 arterial 31.2 (*)    Bicarbonate 17.2 (*)    TCO2 18 (*)    Acid-base deficit 9.0 (*)    Potassium 5.4 (*)    Calcium, Ion 1.11 (*)    All other components within normal limits  POCT I-STAT 4, (NA,K, GLUC, HGB,HCT) - Abnormal; Notable for the following components:   Glucose, Bld 179 (*)    All other components within normal limits  POCT I-STAT 4, (NA,K, GLUC, HGB,HCT) - Abnormal; Notable for the following components:   Glucose, Bld 161 (*)    All other components within normal limits  POCT I-STAT 4, (NA,K, GLUC, HGB,HCT) - Abnormal; Notable for the following components:   Glucose, Bld 145 (*)    HCT 35.0 (*)    Hemoglobin 11.9 (*)    All other  components within normal limits  POCT I-STAT, CHEM 8 - Abnormal; Notable for the following components:   Creatinine, Ser 0.30 (*)    Glucose, Bld 153 (*)    Calcium, Ion 1.09 (*)    TCO2 18 (*)    All other components within normal limits  POCT I-STAT, CHEM 8 - Abnormal; Notable for the following components:   Chloride 116 (*)    Creatinine, Ser 0.30 (*)    Glucose, Bld 150 (*)    TCO2 16 (*)    Hemoglobin 11.2 (*)    HCT 33.0 (*)    All other components within normal limits  TROPONIN I (HIGH SENSITIVITY) - Abnormal; Notable for the following components:   Troponin I (High Sensitivity) 25 (*)    All other components within normal limits  TROPONIN I (HIGH SENSITIVITY) - Abnormal; Notable for the following components:   Troponin I (High Sensitivity) 1,235 (*)    All other components within normal limits  TROPONIN I (HIGH SENSITIVITY) - Abnormal; Notable for the following components:   Troponin I (High Sensitivity) 1,279 (*)  All other components within normal limits  SARS CORONAVIRUS 2 (HOSPITAL ORDER, [place] [facility])  CULTURE, BLOOD (ROUTINE X 2)  CULTURE, BLOOD (ROUTINE X 2)  MRSA PCR SCREENING  PROTIME-INR  ETHANOL  PROTIME-INR  APTT  CREATININE, SERUM  MAGNESIUM  TSH  MAGNESIUM  PHOSPHORUS  PROTIME-INR  BLOOD GAS, ARTERIAL  TYPE AND SCREEN    EKG EKG Interpretation  Date/Time:  Wednesday December 07 2018 10:17:21 EDT Ventricular Rate:  78 PR Interval:    QRS Duration: 134 QT Interval:  484 QTC Calculation: 504 R Axis:   74 Text Interpretation:  Sinus rhythm Paired ventricular premature complexes Right bundle branch block Artifact Abnormal ECG Confirmed by Carmin Muskrat [phone]) on 12/04/2018 10:54:59 AM   Radiology Ct Head Wo Contrast  Result Date: 11/20/2018 CLINICAL DATA:  Head trauma EXAM: CT HEAD WITHOUT CONTRAST CT MAXILLOFACIAL WITHOUT CONTRAST CT CERVICAL SPINE WITHOUT CONTRAST TECHNIQUE: Multidetector CT imaging of the  head, cervical spine, and maxillofacial structures were performed using the standard protocol without intravenous contrast. Multiplanar CT image reconstructions of the cervical spine and maxillofacial structures were also generated. COMPARISON:  None. FINDINGS: CT HEAD FINDINGS Brain: No evidence of acute infarction, hemorrhage, hydrocephalus, extra-axial collection or mass lesion/mass effect. Mild periventricular white matter hypodensity. Vascular: No hyperdense vessel or unexpected calcification. CT FACIAL BONES FINDINGS Skull: Normal. Negative for fracture or focal lesion. Facial bones: Mildly displaced fractures of the nasal bones. Sinuses/Orbits: No acute finding. Other: Soft tissue laceration and hematoma of the forehead. Endotracheal and orogastric intubation. CT CERVICAL SPINE FINDINGS Alignment: Normal. Skull base and vertebrae: There are multipart, mildly displaced Jefferson type fractures of C1, with left and right-sided fractures of the anterior arch (series 10, image 10) and two fractures of the midline posterior and left aspect of the posterior arch (series 10, image 16). There is a mildly displaced transverse type II fracture of the dens (series 9, image 25). Soft tissues and spinal canal: No prevertebral fluid or swelling. No visible canal hematoma. Disc levels: There is anterior cervical discectomy and fusion from C4 through C7 with additional bony ankylosis of C3-C4. Upper chest: Extensive heterogeneous opacity of the partially included left pulmonary apex. Partially imaged right pleural effusion with scattered irregular and ground-glass opacity of the right pulmonary apex. Findings may reflect pulmonary contusion or multifocal airspace disease. There is a probable nondisplaced fracture of the anterior left second rib (series 4, image 103). Other: None. IMPRESSION: 1.  No acute intracranial pathology. 2.  Soft tissue laceration and hematoma of the forehead. 3.  Mildly displaced fractures of the  nasal bones. 4. There are multipart, mildly displaced Jefferson type fractures of C1, with left and right-sided fractures of the anterior arch (series 10, image 10) and two fractures of the midline posterior and left aspect of the posterior arch (series 10, image 16). 5. There is a mildly displaced transverse type II fracture of the dens (series 9, image 25). 6. There is anterior cervical discectomy and fusion from C4 through C7 with additional bony ankylosis of C3-C4. 7. Extensive heterogeneous opacity of the partially included left pulmonary apex. Partially imaged right pleural effusion with scattered irregular and ground-glass opacity of the right pulmonary apex. Findings may reflect pulmonary contusion or multifocal airspace disease. Consider dedicated imaging of the chest. There is a probable nondisplaced fracture of the anterior left second rib (series 4, image 103). There is no displaced fracture or pneumothorax in the partially imaged portions of the chest. These results were called  by telephone at the time of interpretation on 12/01/2018 at 11:33 a.m. to provider Dr. Vanita Panda, who verbally acknowledged these results. Electronically Signed   By: Eddie Candle M.D.   On: 11/21/2018 11:37   Ct Angio Chest Pe W Or Wo Contrast  Result Date: 11/20/2018 CLINICAL DATA:  Cardiac arrest. Syncopal episode with fall this morning. EXAM: CT ANGIOGRAPHY CHEST WITH CONTRAST TECHNIQUE: Multidetector CT imaging of the chest was performed using the standard protocol during bolus administration of intravenous contrast. Multiplanar CT image reconstructions and MIPs were obtained to evaluate the vascular anatomy. CONTRAST:  58mL OMNIPAQUE IOHEXOL 350 MG/ML SOLN COMPARISON:  Chest x-ray dated 12/06/2018 FINDINGS: Cardiovascular: Satisfactory opacification of the pulmonary arteries to the segmental level. No evidence of pulmonary embolism. Normal heart size. No pericardial effusion. Aortic atherosclerosis. Ascending thoracic  aorta has a diameter of 3.8 cm. Mediastinum/Nodes: No enlarged mediastinal, hilar, or axillary lymph nodes. Thyroid gland, trachea, and esophagus demonstrate no significant findings. Endotracheal tube tip is 3 cm above the carina. NG tube is in the stomach. Lungs/Pleura: There are extensive consolidative infiltrates in the left upper and lower lobe with left prominent patchy infiltrates in the right upper and lower lobes with slight atelectasis in the right middle lobe. There is some poorly defined nodularity of the infiltrates in the upper lobes bilaterally. Tiny left effusion. Upper Abdomen: No acute abnormality. Musculoskeletal: No acute abnormality. Soft tissues: There is ill-defined increased density in the right supraclavicular soft tissues suggestive of hemorrhage. Did the patient have attempts at central line insertion in this area? Review of the MIP images confirms the above findings. IMPRESSION: 1. No pulmonary emboli. 2. Extensive bilateral pulmonary infiltrates, left greater than right. 3. Ill-defined increased density in the right supraclavicular soft tissues suggestive of hemorrhage. Did the patient have attempts at central line insertion in this area. Aortic Atherosclerosis (ICD10-I70.0). Electronically Signed   By: Lorriane Shire M.D.   On: 11/21/2018 15:51   Ct Cervical Spine Wo Contrast  Result Date: 12/13/2018 CLINICAL DATA:  Head trauma EXAM: CT HEAD WITHOUT CONTRAST CT MAXILLOFACIAL WITHOUT CONTRAST CT CERVICAL SPINE WITHOUT CONTRAST TECHNIQUE: Multidetector CT imaging of the head, cervical spine, and maxillofacial structures were performed using the standard protocol without intravenous contrast. Multiplanar CT image reconstructions of the cervical spine and maxillofacial structures were also generated. COMPARISON:  None. FINDINGS: CT HEAD FINDINGS Brain: No evidence of acute infarction, hemorrhage, hydrocephalus, extra-axial collection or mass lesion/mass effect. Mild periventricular white  matter hypodensity. Vascular: No hyperdense vessel or unexpected calcification. CT FACIAL BONES FINDINGS Skull: Normal. Negative for fracture or focal lesion. Facial bones: Mildly displaced fractures of the nasal bones. Sinuses/Orbits: No acute finding. Other: Soft tissue laceration and hematoma of the forehead. Endotracheal and orogastric intubation. CT CERVICAL SPINE FINDINGS Alignment: Normal. Skull base and vertebrae: There are multipart, mildly displaced Jefferson type fractures of C1, with left and right-sided fractures of the anterior arch (series 10, image 10) and two fractures of the midline posterior and left aspect of the posterior arch (series 10, image 16). There is a mildly displaced transverse type II fracture of the dens (series 9, image 25). Soft tissues and spinal canal: No prevertebral fluid or swelling. No visible canal hematoma. Disc levels: There is anterior cervical discectomy and fusion from C4 through C7 with additional bony ankylosis of C3-C4. Upper chest: Extensive heterogeneous opacity of the partially included left pulmonary apex. Partially imaged right pleural effusion with scattered irregular and ground-glass opacity of the right pulmonary apex. Findings may reflect  pulmonary contusion or multifocal airspace disease. There is a probable nondisplaced fracture of the anterior left second rib (series 4, image 103). Other: None. IMPRESSION: 1.  No acute intracranial pathology. 2.  Soft tissue laceration and hematoma of the forehead. 3.  Mildly displaced fractures of the nasal bones. 4. There are multipart, mildly displaced Jefferson type fractures of C1, with left and right-sided fractures of the anterior arch (series 10, image 10) and two fractures of the midline posterior and left aspect of the posterior arch (series 10, image 16). 5. There is a mildly displaced transverse type II fracture of the dens (series 9, image 25). 6. There is anterior cervical discectomy and fusion from C4  through C7 with additional bony ankylosis of C3-C4. 7. Extensive heterogeneous opacity of the partially included left pulmonary apex. Partially imaged right pleural effusion with scattered irregular and ground-glass opacity of the right pulmonary apex. Findings may reflect pulmonary contusion or multifocal airspace disease. Consider dedicated imaging of the chest. There is a probable nondisplaced fracture of the anterior left second rib (series 4, image 103). There is no displaced fracture or pneumothorax in the partially imaged portions of the chest. These results were called by telephone at the time of interpretation on 12/09/2018 at 11:33 a.m. to provider Dr. Vanita Panda, who verbally acknowledged these results. Electronically Signed   By: Eddie Candle M.D.   On: 11/27/2018 11:37   Dg Chest Portable 1 View  Result Date: 12/01/2018 CLINICAL DATA:  84 year old female with history of CPR. EXAM: PORTABLE CHEST 1 VIEW COMPARISON:  Chest x-ray 01/17/2016. FINDINGS: The patient is intubated, with the tip of the endotracheal tube at the level of the carina directed toward the right mainstem bronchus. A nasogastric tube is seen extending into the stomach, however, the tip of the nasogastric tube extends below the lower margin of the image. Defibrillation pads project over the upper left abdomen. Patchy multifocal airspace disease in the lungs bilaterally, most confluent in the left upper lobe where there are air bronchograms. No pleural effusions. No evidence of pulmonary edema. Heart size is normal. Right-sided breast implant incidentally noted. Orthopedic fixation hardware in the lower cervical spine incidentally noted. IMPRESSION: 1. Low lying endotracheal tube which should be retracted approximately 4.4 cm for more optimal placement at the level of the thoracic inlet. Per report from the technologist, the MD caring for the patient saw the low-lying tube and pulled the endotracheal tube back immediately after the  examination. Attention on follow-up study is recommended. 2. Multilobar pneumonia, most severe in the left upper lobe. 3. Additional support apparatus, as above. Electronically Signed   By: Vinnie Langton M.D.   On: 11/30/2018 10:47   Ct Maxillofacial Wo Contrast  Result Date: 11/19/2018 CLINICAL DATA:  Head trauma EXAM: CT HEAD WITHOUT CONTRAST CT MAXILLOFACIAL WITHOUT CONTRAST CT CERVICAL SPINE WITHOUT CONTRAST TECHNIQUE: Multidetector CT imaging of the head, cervical spine, and maxillofacial structures were performed using the standard protocol without intravenous contrast. Multiplanar CT image reconstructions of the cervical spine and maxillofacial structures were also generated. COMPARISON:  None. FINDINGS: CT HEAD FINDINGS Brain: No evidence of acute infarction, hemorrhage, hydrocephalus, extra-axial collection or mass lesion/mass effect. Mild periventricular white matter hypodensity. Vascular: No hyperdense vessel or unexpected calcification. CT FACIAL BONES FINDINGS Skull: Normal. Negative for fracture or focal lesion. Facial bones: Mildly displaced fractures of the nasal bones. Sinuses/Orbits: No acute finding. Other: Soft tissue laceration and hematoma of the forehead. Endotracheal and orogastric intubation. CT CERVICAL SPINE FINDINGS Alignment: Normal.  Skull base and vertebrae: There are multipart, mildly displaced Jefferson type fractures of C1, with left and right-sided fractures of the anterior arch (series 10, image 10) and two fractures of the midline posterior and left aspect of the posterior arch (series 10, image 16). There is a mildly displaced transverse type II fracture of the dens (series 9, image 25). Soft tissues and spinal canal: No prevertebral fluid or swelling. No visible canal hematoma. Disc levels: There is anterior cervical discectomy and fusion from C4 through C7 with additional bony ankylosis of C3-C4. Upper chest: Extensive heterogeneous opacity of the partially included left  pulmonary apex. Partially imaged right pleural effusion with scattered irregular and ground-glass opacity of the right pulmonary apex. Findings may reflect pulmonary contusion or multifocal airspace disease. There is a probable nondisplaced fracture of the anterior left second rib (series 4, image 103). Other: None. IMPRESSION: 1.  No acute intracranial pathology. 2.  Soft tissue laceration and hematoma of the forehead. 3.  Mildly displaced fractures of the nasal bones. 4. There are multipart, mildly displaced Jefferson type fractures of C1, with left and right-sided fractures of the anterior arch (series 10, image 10) and two fractures of the midline posterior and left aspect of the posterior arch (series 10, image 16). 5. There is a mildly displaced transverse type II fracture of the dens (series 9, image 25). 6. There is anterior cervical discectomy and fusion from C4 through C7 with additional bony ankylosis of C3-C4. 7. Extensive heterogeneous opacity of the partially included left pulmonary apex. Partially imaged right pleural effusion with scattered irregular and ground-glass opacity of the right pulmonary apex. Findings may reflect pulmonary contusion or multifocal airspace disease. Consider dedicated imaging of the chest. There is a probable nondisplaced fracture of the anterior left second rib (series 4, image 103). There is no displaced fracture or pneumothorax in the partially imaged portions of the chest. These results were called by telephone at the time of interpretation on 12/14/2018 at 11:33 a.m. to provider Dr. Vanita Panda, who verbally acknowledged these results. Electronically Signed   By: Eddie Candle M.D.   On: 11/20/2018 11:37    Procedures Procedures (including critical care time)  INTUBATION Performed by: Carmin Muskrat  Required items: required blood products, implants, devices, and special equipment available Patient identity confirmed: provided demographic data and  hospital-assigned identification number Time out: Immediately prior to procedure a "time out" was called to verify the correct patient, procedure, equipment, support staff and site/side marked as required.  Indications: airway protection  Intubation method: Glidescope Laryngoscopy   Preoxygenation: BVM   Paralytic: 100 rocuronium  Tube Size: 7.5 cuffed  Post-procedure assessment: chest rise and ETCO2 monitor Breath sounds: equal and absent over the epigastrium Tube secured with: ETT holder Chest x-ray interpreted by radiologist and me.  Chest x-ray findings: endotracheal tube in deep position and retracted  Patient tolerated the procedure well with no immediate complications.  For temporary hemostasis the patient had hemostatic gauze applied to her forehead wounds, topical adhesive applied to her nasal wounds.    Medications Ordered in ED Medications  sodium chloride flush (NS) 0.9 % injection 3 mL (has no administration in time range)  rocuronium (ZEMURON) injection (100 mg Intravenous Given 11/24/2018 1019)  EPINEPHrine (ADRENALIN) 1 MG/10ML injection (1 mg Intravenous Given 11/19/2018 1020)     Initial Impression / Assessment and Plan / ED Course  I have reviewed the triage vital signs and the nursing notes.  Pertinent labs & imaging results that were  available during my care of the patient were reviewed by me and considered in my medical decision making (see chart for details).    Immediately after initial evaluation with concern for patient's fall, etiology of this, consideration of ACS versus ICH, versus fall with subsequent trauma, patient had continuous monitoring, had change of King airway for endotracheal tube, which was performed without complication.    After intubation patient was hypotensive, improved after receiving epinephrine, bolus then drip.  11:23 AM Patient has persistent hypotension in spite of increasing epinephrine. Levophed is starting.  Map  68  Patient blood pressure has stabilized, now on levo fed, no epinephrine.  I discussed the patient's case with our radiologist, neurosurgery, critical care team, given her multiple abnormalities.  This elderly female presents after fall, with cardiac arrest, which occurred either prior to or subsequent to the event. Patient has had return of spontaneous regulation, but requires vasopressors for stabilization.  Patient's evaluation also notable for demonstration of complex cervical spine fracture. Neurosurgery aware, no indication for emergent intervention given the patient's critical nature.  After successful intubation patient required admission to the ICU for further monitoring, management.    Final Clinical Impressions(s) / ED Diagnoses   Final diagnoses:  Endotracheally intubated  Cardiac arrest ([place])  Closed unstable burst fracture of first cervical vertebra, initial encounter ([place])  Other closed displaced odontoid fracture, initial encounter ([place])    CRITICAL CARE Performed by: Carmin Muskrat Total critical care time: 35 minutes Critical care time was exclusive of separately billable procedures and treating other patients. Critical care was necessary to treat or prevent imminent or life-threatening deterioration. Critical care was time spent personally by me on the following activities: development of treatment plan with patient and/or surrogate as well as nursing, discussions with consultants, evaluation of patient's response to treatment, examination of patient, obtaining history from patient or surrogate, ordering and performing treatments and interventions, ordering and review of laboratory studies, ordering and review of radiographic studies, pulse oximetry and re-evaluation of patient's condition.    Carmin Muskrat, MD 12/08/18 2205

## 2018-12-08 DIAGNOSIS — Z7189 Other specified counseling: Secondary | ICD-10-CM

## 2018-12-08 DIAGNOSIS — E875 Hyperkalemia: Secondary | ICD-10-CM

## 2018-12-08 LAB — POCT I-STAT, CHEM 8
BUN: 13 mg/dL (ref 8–23)
BUN: 15 mg/dL (ref 8–23)
BUN: 12 mg/dL (ref 8–23)
Calcium, Ion: 1.12 mmol/L — ABNORMAL LOW (ref 1.15–1.40)
Calcium, Ion: 1.09 mmol/L — ABNORMAL LOW (ref 1.15–1.40)
Calcium, Ion: 1.15 mmol/L (ref 1.15–1.40)
Chloride: 110 mmol/L (ref 98–111)
Chloride: 111 mmol/L (ref 98–111)
Chloride: 116 mmol/L — ABNORMAL HIGH (ref 98–111)
Creatinine, Ser: 0.4 mg/dL — ABNORMAL LOW (ref 0.44–1.00)
Creatinine, Ser: 0.3 mg/dL — ABNORMAL LOW (ref 0.44–1.00)
Creatinine, Ser: 0.3 mg/dL — ABNORMAL LOW (ref 0.44–1.00)
Glucose, Bld: 215 mg/dL — ABNORMAL HIGH (ref 70–99)
Glucose, Bld: 153 mg/dL — ABNORMAL HIGH (ref 70–99)
Glucose, Bld: 150 mg/dL — ABNORMAL HIGH (ref 70–99)
HCT: 44 % (ref 36.0–46.0)
HCT: 36 % (ref 36.0–46.0)
HCT: 33 % — ABNORMAL LOW (ref 36.0–46.0)
Hemoglobin: 15 g/dL (ref 12.0–15.0)
Hemoglobin: 12.2 g/dL (ref 12.0–15.0)
Hemoglobin: 11.2 g/dL — ABNORMAL LOW (ref 12.0–15.0)
Potassium: 4.4 mmol/L (ref 3.5–5.1)
Potassium: 4.6 mmol/L (ref 3.5–5.1)
Potassium: 3.7 mmol/L (ref 3.5–5.1)
Sodium: 139 mmol/L (ref 135–145)
Sodium: 144 mmol/L (ref 135–145)
Sodium: 141 mmol/L (ref 135–145)
TCO2: 16 mmol/L — ABNORMAL LOW (ref 22–32)
TCO2: 18 mmol/L — ABNORMAL LOW (ref 22–32)
TCO2: 16 mmol/L — ABNORMAL LOW (ref 22–32)

## 2018-12-08 LAB — CBC
HCT: 39.4 % (ref 36.0–46.0)
Hemoglobin: 12.9 g/dL (ref 12.0–15.0)
MCH: 29.8 pg (ref 26.0–34.0)
MCHC: 32.7 g/dL (ref 30.0–36.0)
MCV: 91 fL (ref 80.0–100.0)
Platelets: 301 10*3/uL (ref 150–400)
RBC: 4.33 MIL/uL (ref 3.87–5.11)
RDW: 14.6 % (ref 11.5–15.5)
WBC: 22.6 10*3/uL — ABNORMAL HIGH (ref 4.0–10.5)
nRBC: 0 % (ref 0.0–0.2)

## 2018-12-08 LAB — GLUCOSE, CAPILLARY
Glucose-Capillary: 207 mg/dL — ABNORMAL HIGH (ref 70–99)
Glucose-Capillary: 207 mg/dL — ABNORMAL HIGH (ref 70–99)
Glucose-Capillary: 176 mg/dL — ABNORMAL HIGH (ref 70–99)
Glucose-Capillary: 179 mg/dL — ABNORMAL HIGH (ref 70–99)
Glucose-Capillary: 189 mg/dL — ABNORMAL HIGH (ref 70–99)
Glucose-Capillary: 168 mg/dL — ABNORMAL HIGH (ref 70–99)
Glucose-Capillary: 171 mg/dL — ABNORMAL HIGH (ref 70–99)

## 2018-12-08 LAB — POCT I-STAT 7, (LYTES, BLD GAS, ICA,H+H)
Acid-base deficit: 9 mmol/L — ABNORMAL HIGH (ref 0.0–2.0)
Bicarbonate: 17.2 mmol/L — ABNORMAL LOW (ref 20.0–28.0)
Calcium, Ion: 1.11 mmol/L — ABNORMAL LOW (ref 1.15–1.40)
HCT: 38 % (ref 36.0–46.0)
Hemoglobin: 12.9 g/dL (ref 12.0–15.0)
O2 Saturation: 98 %
Patient temperature: 33.3
Potassium: 5.4 mmol/L — ABNORMAL HIGH (ref 3.5–5.1)
Sodium: 138 mmol/L (ref 135–145)
TCO2: 18 mmol/L — ABNORMAL LOW (ref 22–32)
pCO2 arterial: 31.2 mmHg — ABNORMAL LOW (ref 32.0–48.0)
pH, Arterial: 7.331 — ABNORMAL LOW (ref 7.350–7.450)
pO2, Arterial: 95 mmHg (ref 83.0–108.0)

## 2018-12-08 LAB — POCT I-STAT 4, (NA,K, GLUC, HGB,HCT)
Glucose, Bld: 179 mg/dL — ABNORMAL HIGH (ref 70–99)
Glucose, Bld: 161 mg/dL — ABNORMAL HIGH (ref 70–99)
Glucose, Bld: 145 mg/dL — ABNORMAL HIGH (ref 70–99)
HCT: 37 % (ref 36.0–46.0)
HCT: 37 % (ref 36.0–46.0)
HCT: 35 % — ABNORMAL LOW (ref 36.0–46.0)
Hemoglobin: 12.6 g/dL (ref 12.0–15.0)
Hemoglobin: 12.6 g/dL (ref 12.0–15.0)
Hemoglobin: 11.9 g/dL — ABNORMAL LOW (ref 12.0–15.0)
Potassium: 4.9 mmol/L (ref 3.5–5.1)
Potassium: 4.6 mmol/L (ref 3.5–5.1)
Potassium: 4.1 mmol/L (ref 3.5–5.1)
Sodium: 138 mmol/L (ref 135–145)
Sodium: 139 mmol/L (ref 135–145)
Sodium: 140 mmol/L (ref 135–145)

## 2018-12-08 LAB — BASIC METABOLIC PANEL
Anion gap: 11 (ref 5–15)
Anion gap: 8 (ref 5–15)
BUN: 13 mg/dL (ref 8–23)
BUN: 12 mg/dL (ref 8–23)
CO2: 15 mmol/L — ABNORMAL LOW (ref 22–32)
CO2: 16 mmol/L — ABNORMAL LOW (ref 22–32)
Calcium: 7.5 mg/dL — ABNORMAL LOW (ref 8.9–10.3)
Calcium: 7.6 mg/dL — ABNORMAL LOW (ref 8.9–10.3)
Chloride: 112 mmol/L — ABNORMAL HIGH (ref 98–111)
Chloride: 113 mmol/L — ABNORMAL HIGH (ref 98–111)
Creatinine, Ser: 0.67 mg/dL (ref 0.44–1.00)
Creatinine, Ser: 0.5 mg/dL (ref 0.44–1.00)
GFR calc Af Amer: >60 mL/min (ref >60)
GFR calc Af Amer: >60 mL/min (ref >60)
GFR calc non Af Amer: >60 mL/min (ref >60)
GFR calc non Af Amer: >60 mL/min (ref >60)
Glucose, Bld: 217 mg/dL — ABNORMAL HIGH (ref 70–99)
Glucose, Bld: 160 mg/dL — ABNORMAL HIGH (ref 70–99)
Potassium: 5 mmol/L (ref 3.5–5.1)
Potassium: 4.2 mmol/L (ref 3.5–5.1)
Sodium: 138 mmol/L (ref 135–145)
Sodium: 137 mmol/L (ref 135–145)

## 2018-12-08 LAB — PROTIME-INR
INR: 1.1 (ref 0.8–1.2)
Prothrombin Time: 13.8 seconds (ref 11.4–15.2)

## 2018-12-08 LAB — LACTIC ACID, PLASMA: Lactic Acid, Venous: 3.8 mmol/L (ref 0.5–1.9)

## 2018-12-08 LAB — MAGNESIUM: Magnesium: 2.4 mg/dL (ref 1.7–2.4)

## 2018-12-08 LAB — PHOSPHORUS: Phosphorus: 3.4 mg/dL (ref 2.5–4.6)

## 2018-12-08 MED ORDER — SODIUM POLYSTYRENE SULFONATE 15 GM/60ML PO SUSP
30.0000 g | Freq: Once | ORAL | Status: AC
  Administered 2018-12-08 (×2): 30 g
  Filled 2018-12-08: qty 120

## 2018-12-08 MED ORDER — SODIUM POLYSTYRENE SULFONATE 15 GM/60ML PO SUSP
30.0000 g | Freq: Once | ORAL | Status: AC
  Administered 2018-12-08: 30 g
  Filled 2018-12-08: qty 120

## 2018-12-08 MED ORDER — SODIUM POLYSTYRENE SULFONATE 15 GM/60ML PO SUSP
30.0000 g | Freq: Once | ORAL | Status: DC
  Filled 2018-12-08: qty 120

## 2018-12-08 MED FILL — Medication: Qty: 1 | Status: AC

## 2018-12-08 NOTE — Progress Notes (Signed)
NAME:  Colleen [name], MRN:  [id number], DOB:  10-14-1934, LOS: 1 ADMISSION DATE:  11/26/2018, CONSULTATION DATE:  9/23 REFERRING MD:  Vanita Panda, CHIEF COMPLAINT:  Cardiac arrest   Brief History   84 y/o F who presented to [facility] on 9/23 after an unwitnessed fall at home, found to be in asystole requiring 15 min of EMS CPR prior to [place].  Intubated on arrival, imaging on admission with C1 and C2 fractures, & significant left sided pulmonary infiltrates.   History of present illness   84 yo F who presented to [facility] on 9/23 after an unwitnessed fall at home, found by husband face down with pool of blood under her head. On arrival EMS found patient pulseless in asystole. The patient required 15 min of CPR prior to ROSC achieved.    Patients daughter states that patient was at home with husband when she attempted to ambulate to the restroom at which time she suffered the fall. Patient fell forward striking her head with no signs of attempts to prevent fall. Daughter stated that patients husband did not witness the fall. Daughter reported patient has been suffering from multiple falls recently, last major fall was spring of this year that resulted in fracture to left small finger.   Initial ER evaluation notable for hypotension, tachycardia and hypothermia. Labwork on admission with elevated troponin and lactic acid. ABG 7.25 / 42 / 150 / 19.  Imaging on admission including CT cervical spine, head, and maxillfacial revealed C1 multipart Jefferson type fracture, mildly displaced transverse type II fracture to C2. The patient was intubated in the ER for airway protection.    PCCM called for ICU admission.   Past Medical History  Hypertension Hypothyroidism Heart Murmur  Right breast cancer Mitral valve disorder Osteoporosis   Significant Hospital Events   9/23 Admitted post 15 min CPR, TTM initiated  Consults:  Neurosurgery  Neurology cardiology  Procedures:  Lleft femoral central line 9/23 >>   Significant Diagnostic Tests:  9/23  CT Cervical Spine, Head, and Maxillfacial revealed C1 multipart Jefferson type fracture, mildly displaced transverse type II fracture to C2, multiple nasal fractures, pulmonary contusions   9/23 echo: LVEF 40-45%. Akinesis of the mid to apical anterior, lateral and inferior walls with basilar sparing. Apprearance of Takotsubo. No valvular abnormalities noted  Micro Data:  COVID 9/23 >> neg BCx 9/23 >>  Antimicrobials:  Azithromycin 9/23 x1 dose Ceftriaxone 9/23 >> Flagyl 9/23 >>  Interim history/subjective:  Intubated. Sedated. No changes overnight.   Objective   Blood pressure 116/74, pulse 65, temperature (!) 91.2 F (32.9 C), temperature source Bladder, resp. rate 13, height 5\' 6"  (1.676 m), weight 79.3 kg, SpO2 98 %. CVP:  [2 mmHg-10 mmHg] 2 mmHg  Vent Mode: PRVC FiO2 (%):  [40 %-100 %] 40 % Set Rate:  [14 bmp-16 bmp] 14 bmp Vt Set:  [470 mL] 470 mL PEEP:  [5 cmH20] 5 cmH20 Plateau Pressure:  [20 cmH20-22 cmH20] 20 cmH20   Intake/Output Summary (Last 24 hours) at 12/08/2018 0859 Last data filed at 12/08/2018 0830 Gross per 24 hour  Intake 3385.68 ml  Output 551 ml  Net 2834.68 ml   Filed Weights   11/15/2018 1800 12/08/18 0400  Weight: 82.6 kg 79.3 kg    Examination: General: acutely ill appearing. [place] sun in place HEENT: ETT Neuro: sedated. Paralyzed. No pupillary reflex CV: RRR. No peripheral edema appreciated. PULM:  Course breath sounds bilaterally, compliant on vent GI: bowel sounds active. abd soft Extremities: cool  Skin: laceration to forehead. No active bleeding from site.  Resolved Hospital Problem list      Assessment & Plan:   OOH Asystolic Cardiac arrest. Time to ROSC 15 min. Etiology unclear. Initial EKG significant for new RBBB and QT prolongation.. Trops trending up: 25-->1200-->1300.  Echo findings consistent with Takotsubo CM.  CTA neg for PE.  P: Will continue to hold psych meds due to QT prolongation  No heparin IV due to head trama Further workup pending neurologic recovery  Acute Hypoxic Respiratory failure Left Upper Lobe Infiltrates, Suspected Aspiration -Post cardiac arrest, LUL pneumonia possible secondary to aspiration P: continue roceph, flagyl. Consider switching to cefepime  Acute on chronic encephalopathy s/p cardiac arrest Neuro on board EEG findings suggest profound encephalopathy consistent with diffuse anoxic vs hypoxic brain injury. No seizure patterns noted. Given her age, prolonged downtime without CPR and baseline comorbidities, patient has poor prognosis. Per neurology, disscussion was held with family regarding patient's wishes--notes she would not want to be in persistive vegetative state. Plan: Continue TTM protocol Await re-warming for neuro prognostication  Will continue conversation with them today when they arrive on unit.  Traumatic fracture C1 and C2 s/p fall/cardiac arrest Traumatic multiple nasal bone fractures  neurosurg on board P: Maintain ridge C-collar. neurosurg will hold off on further management due to anoxic brain injury. Ensure adequate pain control   Hypothyroidism. Continue synthroid Dementia/Chronic Back Pain. Hold home namenda, aricept, zyprexa, fluvoxamine, neurontin Hx HTN. Hold home lasix, benicar  Best practice:  Diet: NPO Pain/Anxiety/Delirium protocol (if indicated): Fentanyl and Versed drip  VAP protocol (if indicated): In place  DVT prophylaxis: heparin  GI prophylaxis: pepcid  Glucose control: N/A Mobility: Bedrest  Code Status: Full code Family Communication: If patient does not respond to Hypothermia protocol family would not want any further aggressive measures including trach or PEG tube.   Disposition: Cardiovascular ICU  Mitzi Hansen, MD Internal Medicine Resident PGY1 12/08/18 9:00 AM  Attending Note:  84 year old female with PMH of CHF presenting post CPR with ROSC and C2 fracture after a fall.  No  events overnight.  Remains on hypothermia protocol to start warming up today at 5PM.  On exam, unresponsive, paralyzed with clear lungs.  I reviewed CXR myself, ETT is in a good position.  Discussed with resident and family.  Will continue full vent support.  Kayexalate for hyperkalemia.  Repeat BMET at 8 PM.  Spoke with family extensively today, after a long discussion decision was made to proceed with no CPR/cardioversion, short term intubation only until able to evaluate mental status.  PCCM will continue to follow.  The patient is critically ill with multiple organ systems failure and requires high complexity decision making for assessment and support, frequent evaluation and titration of therapies, application of advanced monitoring technologies and extensive interpretation of multiple databases.   Critical Care Time devoted to patient care services described in this note is  33  Minutes. This time reflects time of care of this signee Dr [name]. This critical care time does not reflect procedure time, or teaching time or supervisory time of PA/NP/Med student/Med Resident etc but could involve care discussion time.  Rush Farmer, M.D. [facility] Pulmonary/Critical Care Medicine. Pager: [phone]. After hours pager: [phone].

## 2018-12-08 NOTE — Progress Notes (Signed)
Initial Nutrition Assessment  RD working remotely.  DOCUMENTATION CODES:   Not applicable  INTERVENTION:   Tube feeding recommendations: - Vital AF 1.2 @ 20 ml/hr (480 ml/day)  Recommended tube feeding regimen provides 576 kcal, 36 grams of protein, and 389 ml of H2O.    Once rewarmed, recommend: - Vital AF 1.2 @ 40 ml/hr (960 ml/day) - Pro-stat 30 ml BID  Recommended tube feeding regimen provides 1352 kcal, 102 grams of protein, and 779 ml of H2O.   NUTRITION DIAGNOSIS:   Inadequate oral intake related to inability to eat as evidenced by NPO status.  GOAL:   Patient will meet greater than or equal to 90% of their needs  MONITOR:   Vent status, Labs, Weight trends, I & O's  REASON FOR ASSESSMENT:   Ventilator    ASSESSMENT:   84 year old female who presented to the ED on 9/23 after a fall secondary to cardiac arrest. Pt found in asystole by EMS and 15 minutes of CPR performed prior to ROSC. Pt was intubated on arrival. TTM 33 degrees initiated. Pt found to have C1 and C2 fractures and significant left-sided pulmonary infiltrates. PMH of HTN, Alzheimer's.   EEG findings suggest profound encephalopathy consistent with diffuse anoxic vs hypoxic brain injury. Per CCM note today, family has decided "to proceed with no CPR/cardioversion, short term intubation only until able to evaluate mental status."  OG tube in place, currently to low intermittent suction.  Per RN edema assessment, pt with non-pitting edema to BUE and BLE.  Patient is currently intubated on ventilator support MV: 6.6 L/min Temp (24hrs), Avg:91.7 F (33.2 C), Min:88.5 F (31.4 C), Max:94.5 F (34.7 C) BP (a-line): 117/60 MAP (a-line): 82  Drips: Fentanyl: 10 ml/hr Versed: 2 ml/hr Nimbex: 14.9 ml/hr Levophed: 18.7 ml/hr NS: 50 ml/hr  Medications reviewed and include: SSI, Kayexalate 30 grams twice, IV abx, IV Pepcid  Labs reviewed: potassium 5.4, ionized calcium 1.11 CBG's: 176-207 x 12  hours  UOP: 508 ml x 24 hours OGT: 23 ml x 24 hours I/O's: +3.0 L since admit  Diet Order:   Diet Order    None      EDUCATION NEEDS:   No education needs have been identified at this time  Skin:  Skin Assessment: Reviewed RN Assessment  Last BM:  no documented BM  Height:   Ht Readings from Last 1 Encounters:  12/10/2018 5\' 6"  (1.676 m)    Weight:   Wt Readings from Last 1 Encounters:  12/08/18 79.3 kg    Ideal Body Weight:  59.1 kg  BMI:  Body mass index is 28.22 kg/m.  Estimated Nutritional Needs:   Kcal:  1401  Protein:  100-115 grams  Fluid:  >/= 1.5 L    Gaynell Face, MS, RD, LDN Inpatient Clinical Dietitian Pager: [phone] Weekend/After Hours: [phone]

## 2018-12-08 NOTE — Progress Notes (Signed)
Progress Note  Patient Name: Colleen [name] Date of Encounter: 12/08/2018  Primary Cardiologist: No primary care provider on file. new  Subjective   Unable to obtain.  Intubated and sedated.   Inpatient Medications    Scheduled Meds:  artificial tears  1 application Both Eyes X4V   chlorhexidine gluconate (MEDLINE KIT)  15 mL Mouth Rinse BID   Chlorhexidine Gluconate Cloth  6 each Topical Daily   heparin  5,000 Units Subcutaneous Q8H   insulin aspart  1-3 Units Subcutaneous Q4H   levothyroxine  50 mcg Per Tube Daily   mouth rinse  15 mL Mouth Rinse 10 times per day   nitroGLYCERIN  1 inch Topical Q8H   sodium chloride flush  10-40 mL Intracatheter Q12H   Continuous Infusions:  sodium chloride 50 mL/hr at 12/08/18 0830   sodium chloride 10 mL/hr at 12/08/18 0830   cefTRIAXone (ROCEPHIN)  IV Stopped (12/08/18 0121)   cisatracurium (NIMBEX) infusion 3 mcg/kg/min (12/08/18 0830)   epinephrine Stopped (12/08/2018 1243)   famotidine (PEPCID) IV Stopped (11/24/2018 2320)   fentaNYL infusion INTRAVENOUS 100 mcg/hr (12/08/18 0830)   metronidazole Stopped (12/08/18 8592)   midazolam 2 mg/hr (12/08/18 0830)   norepinephrine (LEVOPHED) Adult infusion 5 mcg/min (12/08/18 0830)   PRN Meds: sodium chloride, fentaNYL, midazolam, neomycin-bacitracin-polymyxin, sodium chloride flush   Vital Signs    Vitals:   12/08/18 0749 12/08/18 0800 12/08/18 0830 12/08/18 0930  BP: (!) 115/55 122/71 116/74 118/66  Pulse: 63 72 65 74  Resp: '14 14 13 14  ' Temp:   (!) 91.2 F (32.9 C) (!) 91.2 F (32.9 C)  TempSrc:   Bladder Bladder  SpO2: 100% 99% 98% 100%  Weight:      Height:        Intake/Output Summary (Last 24 hours) at 12/08/2018 0951 Last data filed at 12/08/2018 0830 Gross per 24 hour  Intake 3385.68 ml  Output 551 ml  Net 2834.68 ml   Last 3 Weights 12/08/2018 12/14/2018 06/27/2015  Weight (lbs) 174 lb 13.2 oz 182 lb 1.6 oz 150 lb  Weight (kg) 79.3 kg 82.6 kg 68.04  kg  Some encounter information is confidential and restricted. Go to Review Flowsheets activity to see all data.      Telemetry    Sinus rhythm.  PVCs - Personally Reviewed  ECG    Sinus rhythm.  Rate 77 bpm.  Anterolateral TWI. Low voltage QRS. - Personally Reviewed  Physical Exam   VS:  BP 118/66    Pulse 74    Temp (!) 91.2 F (32.9 C) (Bladder)    Resp 14    Ht '5\' 6"'  (1.676 m)    Wt 79.3 kg    SpO2 100%    BMI 28.22 kg/m  , BMI Body mass index is 28.22 kg/m. GENERAL:  Critically ill-appearing.  Intubated, sedated and paralyzed. HEENT: Ecchymosis and trauma to mid face.  Oral mucosa unremarkable NECK:  No jugular venous distention, waveform within normal limits, carotid upstroke brisk and symmetric, no bruits LUNGS:  Clear to auscultation bilaterally on anterior exam.  Vented breath sounds. HEART:  RRR.  PMI not displaced or sustained,S1 and S2 within normal limits, no S3, no S4, no clicks, no rubs, no murmurs ABD:  Flat, positive bowel sounds normal in frequency in pitch, no bruits, no rebound, no guarding, no midline pulsatile mass, no hepatomegaly, no splenomegaly EXT:  Cool. No edema, no cyanosis no clubbing SKIN:  No rashes no nodules NEURO:  Unable to  assess. Intubated and sedated.  PSYCH:  Unable to assess. Intubated and sedated.    Labs    High Sensitivity Troponin:   Recent Labs  Lab 11/25/2018 1031 11/21/2018 1601 11/22/2018 1723  TROPONINIHS 25* 1,235* 1,279*      Chemistry Recent Labs  Lab 11/21/2018 1031  12/10/2018 1601 11/29/2018 1813 12/12/2018 2031 11/15/2018 2352 12/08/18 0403 12/08/18 0610  NA 137   < >  --  141 139 137 138 138  K 4.2   < >  --  3.5 4.4 5.3* 5.0 5.4*  CL 106   < >  --  112* 110  --  112*  --   CO2 23  --   --   --   --   --  15*  --   GLUCOSE 191*   < >  --  226* 215* 216* 217*  --   BUN 9   < >  --  12 13  --  13  --   CREATININE 0.94   < > 0.64 0.40* 0.40*  --  0.67  --   CALCIUM 8.1*  --   --   --   --   --  7.5*  --   GFRNONAA 57*   --  >60  --   --   --  >60  --   GFRAA >60  --  >60  --   --   --  >60  --   ANIONGAP 8  --   --   --   --   --  11  --    < > = values in this interval not displayed.     Hematology Recent Labs  Lab 12/06/2018 1031  12/06/2018 2352 12/08/18 0403 12/08/18 0610  WBC 9.2  --   --  22.6*  --   RBC 3.92  --   --  4.33  --   HGB 11.6*   < > 14.3 12.9 12.9  HCT 36.8   < > 42.0 39.4 38.0  MCV 93.9  --   --  91.0  --   MCH 29.6  --   --  29.8  --   MCHC 31.5  --   --  32.7  --   RDW 14.5  --   --  14.6  --   PLT 237  --   --  301  --    < > = values in this interval not displayed.    BNPNo results for input(s): BNP, PROBNP in the last 168 hours.   DDimer No results for input(s): DDIMER in the last 168 hours.   Radiology    Ct Head Wo Contrast  Result Date: 11/24/2018 CLINICAL DATA:  Head trauma EXAM: CT HEAD WITHOUT CONTRAST CT MAXILLOFACIAL WITHOUT CONTRAST CT CERVICAL SPINE WITHOUT CONTRAST TECHNIQUE: Multidetector CT imaging of the head, cervical spine, and maxillofacial structures were performed using the standard protocol without intravenous contrast. Multiplanar CT image reconstructions of the cervical spine and maxillofacial structures were also generated. COMPARISON:  None. FINDINGS: CT HEAD FINDINGS Brain: No evidence of acute infarction, hemorrhage, hydrocephalus, extra-axial collection or mass lesion/mass effect. Mild periventricular white matter hypodensity. Vascular: No hyperdense vessel or unexpected calcification. CT FACIAL BONES FINDINGS Skull: Normal. Negative for fracture or focal lesion. Facial bones: Mildly displaced fractures of the nasal bones. Sinuses/Orbits: No acute finding. Other: Soft tissue laceration and hematoma of the forehead. Endotracheal and orogastric intubation. CT CERVICAL SPINE FINDINGS Alignment: Normal. Skull base and  vertebrae: There are multipart, mildly displaced Jefferson type fractures of C1, with left and right-sided fractures of the anterior arch  (series 10, image 10) and two fractures of the midline posterior and left aspect of the posterior arch (series 10, image 16). There is a mildly displaced transverse type II fracture of the dens (series 9, image 25). Soft tissues and spinal canal: No prevertebral fluid or swelling. No visible canal hematoma. Disc levels: There is anterior cervical discectomy and fusion from C4 through C7 with additional bony ankylosis of C3-C4. Upper chest: Extensive heterogeneous opacity of the partially included left pulmonary apex. Partially imaged right pleural effusion with scattered irregular and ground-glass opacity of the right pulmonary apex. Findings may reflect pulmonary contusion or multifocal airspace disease. There is a probable nondisplaced fracture of the anterior left second rib (series 4, image 103). Other: None. IMPRESSION: 1.  No acute intracranial pathology. 2.  Soft tissue laceration and hematoma of the forehead. 3.  Mildly displaced fractures of the nasal bones. 4. There are multipart, mildly displaced Jefferson type fractures of C1, with left and right-sided fractures of the anterior arch (series 10, image 10) and two fractures of the midline posterior and left aspect of the posterior arch (series 10, image 16). 5. There is a mildly displaced transverse type II fracture of the dens (series 9, image 25). 6. There is anterior cervical discectomy and fusion from C4 through C7 with additional bony ankylosis of C3-C4. 7. Extensive heterogeneous opacity of the partially included left pulmonary apex. Partially imaged right pleural effusion with scattered irregular and ground-glass opacity of the right pulmonary apex. Findings may reflect pulmonary contusion or multifocal airspace disease. Consider dedicated imaging of the chest. There is a probable nondisplaced fracture of the anterior left second rib (series 4, image 103). There is no displaced fracture or pneumothorax in the partially imaged portions of the chest.  These results were called by telephone at the time of interpretation on 12/13/2018 at 11:33 a.m. to provider Dr. Vanita Panda, who verbally acknowledged these results. Electronically Signed   By: Eddie Candle M.D.   On: 11/20/2018 11:37   Ct Angio Chest Pe W Or Wo Contrast  Result Date: 12/05/2018 CLINICAL DATA:  Cardiac arrest. Syncopal episode with fall this morning. EXAM: CT ANGIOGRAPHY CHEST WITH CONTRAST TECHNIQUE: Multidetector CT imaging of the chest was performed using the standard protocol during bolus administration of intravenous contrast. Multiplanar CT image reconstructions and MIPs were obtained to evaluate the vascular anatomy. CONTRAST:  7m OMNIPAQUE IOHEXOL 350 MG/ML SOLN COMPARISON:  Chest x-ray dated 12/06/2018 FINDINGS: Cardiovascular: Satisfactory opacification of the pulmonary arteries to the segmental level. No evidence of pulmonary embolism. Normal heart size. No pericardial effusion. Aortic atherosclerosis. Ascending thoracic aorta has a diameter of 3.8 cm. Mediastinum/Nodes: No enlarged mediastinal, hilar, or axillary lymph nodes. Thyroid gland, trachea, and esophagus demonstrate no significant findings. Endotracheal tube tip is 3 cm above the carina. NG tube is in the stomach. Lungs/Pleura: There are extensive consolidative infiltrates in the left upper and lower lobe with left prominent patchy infiltrates in the right upper and lower lobes with slight atelectasis in the right middle lobe. There is some poorly defined nodularity of the infiltrates in the upper lobes bilaterally. Tiny left effusion. Upper Abdomen: No acute abnormality. Musculoskeletal: No acute abnormality. Soft tissues: There is ill-defined increased density in the right supraclavicular soft tissues suggestive of hemorrhage. Did the patient have attempts at central line insertion in this area? Review of the MIP images confirms  the above findings. IMPRESSION: 1. No pulmonary emboli. 2. Extensive bilateral pulmonary  infiltrates, left greater than right. 3. Ill-defined increased density in the right supraclavicular soft tissues suggestive of hemorrhage. Did the patient have attempts at central line insertion in this area. Aortic Atherosclerosis (ICD10-I70.0). Electronically Signed   By: Lorriane Shire M.D.   On: 11/26/2018 15:51   Ct Cervical Spine Wo Contrast  Result Date: 12/13/2018 CLINICAL DATA:  Head trauma EXAM: CT HEAD WITHOUT CONTRAST CT MAXILLOFACIAL WITHOUT CONTRAST CT CERVICAL SPINE WITHOUT CONTRAST TECHNIQUE: Multidetector CT imaging of the head, cervical spine, and maxillofacial structures were performed using the standard protocol without intravenous contrast. Multiplanar CT image reconstructions of the cervical spine and maxillofacial structures were also generated. COMPARISON:  None. FINDINGS: CT HEAD FINDINGS Brain: No evidence of acute infarction, hemorrhage, hydrocephalus, extra-axial collection or mass lesion/mass effect. Mild periventricular white matter hypodensity. Vascular: No hyperdense vessel or unexpected calcification. CT FACIAL BONES FINDINGS Skull: Normal. Negative for fracture or focal lesion. Facial bones: Mildly displaced fractures of the nasal bones. Sinuses/Orbits: No acute finding. Other: Soft tissue laceration and hematoma of the forehead. Endotracheal and orogastric intubation. CT CERVICAL SPINE FINDINGS Alignment: Normal. Skull base and vertebrae: There are multipart, mildly displaced Jefferson type fractures of C1, with left and right-sided fractures of the anterior arch (series 10, image 10) and two fractures of the midline posterior and left aspect of the posterior arch (series 10, image 16). There is a mildly displaced transverse type II fracture of the dens (series 9, image 25). Soft tissues and spinal canal: No prevertebral fluid or swelling. No visible canal hematoma. Disc levels: There is anterior cervical discectomy and fusion from C4 through C7 with additional bony ankylosis  of C3-C4. Upper chest: Extensive heterogeneous opacity of the partially included left pulmonary apex. Partially imaged right pleural effusion with scattered irregular and ground-glass opacity of the right pulmonary apex. Findings may reflect pulmonary contusion or multifocal airspace disease. There is a probable nondisplaced fracture of the anterior left second rib (series 4, image 103). Other: None. IMPRESSION: 1.  No acute intracranial pathology. 2.  Soft tissue laceration and hematoma of the forehead. 3.  Mildly displaced fractures of the nasal bones. 4. There are multipart, mildly displaced Jefferson type fractures of C1, with left and right-sided fractures of the anterior arch (series 10, image 10) and two fractures of the midline posterior and left aspect of the posterior arch (series 10, image 16). 5. There is a mildly displaced transverse type II fracture of the dens (series 9, image 25). 6. There is anterior cervical discectomy and fusion from C4 through C7 with additional bony ankylosis of C3-C4. 7. Extensive heterogeneous opacity of the partially included left pulmonary apex. Partially imaged right pleural effusion with scattered irregular and ground-glass opacity of the right pulmonary apex. Findings may reflect pulmonary contusion or multifocal airspace disease. Consider dedicated imaging of the chest. There is a probable nondisplaced fracture of the anterior left second rib (series 4, image 103). There is no displaced fracture or pneumothorax in the partially imaged portions of the chest. These results were called by telephone at the time of interpretation on 11/18/2018 at 11:33 a.m. to provider Dr. Vanita Panda, who verbally acknowledged these results. Electronically Signed   By: Eddie Candle M.D.   On: 12/02/2018 11:37   Dg Chest Portable 1 View  Result Date: 12/04/2018 CLINICAL DATA:  83 year old female with history of CPR. EXAM: PORTABLE CHEST 1 VIEW COMPARISON:  Chest x-ray 01/17/2016. FINDINGS:  The  patient is intubated, with the tip of the endotracheal tube at the level of the carina directed toward the right mainstem bronchus. A nasogastric tube is seen extending into the stomach, however, the tip of the nasogastric tube extends below the lower margin of the image. Defibrillation pads project over the upper left abdomen. Patchy multifocal airspace disease in the lungs bilaterally, most confluent in the left upper lobe where there are air bronchograms. No pleural effusions. No evidence of pulmonary edema. Heart size is normal. Right-sided breast implant incidentally noted. Orthopedic fixation hardware in the lower cervical spine incidentally noted. IMPRESSION: 1. Low lying endotracheal tube which should be retracted approximately 4.4 cm for more optimal placement at the level of the thoracic inlet. Per report from the technologist, the MD caring for the patient saw the low-lying tube and pulled the endotracheal tube back immediately after the examination. Attention on follow-up study is recommended. 2. Multilobar pneumonia, most severe in the left upper lobe. 3. Additional support apparatus, as above. Electronically Signed   By: Vinnie Langton M.D.   On: 12/08/2018 10:47   Ct Maxillofacial Wo Contrast  Result Date: 11/15/2018 CLINICAL DATA:  Head trauma EXAM: CT HEAD WITHOUT CONTRAST CT MAXILLOFACIAL WITHOUT CONTRAST CT CERVICAL SPINE WITHOUT CONTRAST TECHNIQUE: Multidetector CT imaging of the head, cervical spine, and maxillofacial structures were performed using the standard protocol without intravenous contrast. Multiplanar CT image reconstructions of the cervical spine and maxillofacial structures were also generated. COMPARISON:  None. FINDINGS: CT HEAD FINDINGS Brain: No evidence of acute infarction, hemorrhage, hydrocephalus, extra-axial collection or mass lesion/mass effect. Mild periventricular white matter hypodensity. Vascular: No hyperdense vessel or unexpected calcification. CT FACIAL  BONES FINDINGS Skull: Normal. Negative for fracture or focal lesion. Facial bones: Mildly displaced fractures of the nasal bones. Sinuses/Orbits: No acute finding. Other: Soft tissue laceration and hematoma of the forehead. Endotracheal and orogastric intubation. CT CERVICAL SPINE FINDINGS Alignment: Normal. Skull base and vertebrae: There are multipart, mildly displaced Jefferson type fractures of C1, with left and right-sided fractures of the anterior arch (series 10, image 10) and two fractures of the midline posterior and left aspect of the posterior arch (series 10, image 16). There is a mildly displaced transverse type II fracture of the dens (series 9, image 25). Soft tissues and spinal canal: No prevertebral fluid or swelling. No visible canal hematoma. Disc levels: There is anterior cervical discectomy and fusion from C4 through C7 with additional bony ankylosis of C3-C4. Upper chest: Extensive heterogeneous opacity of the partially included left pulmonary apex. Partially imaged right pleural effusion with scattered irregular and ground-glass opacity of the right pulmonary apex. Findings may reflect pulmonary contusion or multifocal airspace disease. There is a probable nondisplaced fracture of the anterior left second rib (series 4, image 103). Other: None. IMPRESSION: 1.  No acute intracranial pathology. 2.  Soft tissue laceration and hematoma of the forehead. 3.  Mildly displaced fractures of the nasal bones. 4. There are multipart, mildly displaced Jefferson type fractures of C1, with left and right-sided fractures of the anterior arch (series 10, image 10) and two fractures of the midline posterior and left aspect of the posterior arch (series 10, image 16). 5. There is a mildly displaced transverse type II fracture of the dens (series 9, image 25). 6. There is anterior cervical discectomy and fusion from C4 through C7 with additional bony ankylosis of C3-C4. 7. Extensive heterogeneous opacity of the  partially included left pulmonary apex. Partially imaged right pleural effusion with scattered irregular  and ground-glass opacity of the right pulmonary apex. Findings may reflect pulmonary contusion or multifocal airspace disease. Consider dedicated imaging of the chest. There is a probable nondisplaced fracture of the anterior left second rib (series 4, image 103). There is no displaced fracture or pneumothorax in the partially imaged portions of the chest. These results were called by telephone at the time of interpretation on 12/09/2018 at 11:33 a.m. to provider Dr. Vanita Panda, who verbally acknowledged these results. Electronically Signed   By: Eddie Candle M.D.   On: 11/22/2018 11:37    Cardiac Studies   Echo 11/29/2018: IMPRESSIONS   1. Left ventricular ejection fraction, by visual estimation, is 40 to 45%. The left ventricle has normal function. Normal left ventricular size. There is no left ventricular hypertrophy.  2. Left ventricular diastolic Doppler parameters are consistent with impaired relaxation pattern of LV diastolic filling.  3. Akinesis of the mid to apical anterior, lateral and inferior walls with basilar sparing. Apprearance of Takotsubo.     LVOT hypercontractile - peak velocity 2.65ms, mean gradient 222mg.  4. Global right ventricle has normal systolic function.The right ventricular size is normal. No increase in right ventricular wall thickness.  5. Left atrial size was normal.  6. Right atrial size was normal.  7. Mild mitral annular calcification.  8. The mitral valve is normal in structure. No evidence of mitral valve regurgitation. No evidence of mitral stenosis.  9. The tricuspid valve is normal in structure. Tricuspid valve regurgitation was not visualized by color flow Doppler. 10. The aortic valve is normal in structure. Aortic valve regurgitation is mild by color flow Doppler. Mild aortic valve sclerosis without stenosis. 11. The pulmonic valve was normal in  structure. Pulmonic valve regurgitation is not visualized by color flow Doppler. 12. The inferior vena cava is normal in size with greater than 50% respiratory variability, suggesting right atrial pressure of 3 mmHg.  Patient Profile     [age].o. female with hypertension, Alzheimer's disease, and hypothyroidism here with out of hospital cardiac arrest.  Assessment & Plan    # Cardiac arrest:  Echo showed Takotsubo cardiomyopathy.  Family reports she was anxious and perseverating about an upcoming surgery.  CXR also concerning for pneumonia, though she had no symptoms of pneumonia prior to the event.  She currently is being cooled and has anterolateral TWI on EKG.  Requiring levophed for BP support.  Her last CVP was 2 and UOP has been sluggish.  Would consider increasing IV fluids.  Will await neurologic assessment post cooling.  Overall prognosis poor given her age, underlying conditions and the time she was down.  Time spent: 35 minutes-Greater than 50% of this time was spent in counseling, explanation of diagnosis, planning of further management, and coordination of care.      For questions or updates, please contact [facility] consult [email or web address] for contact info under        Signed, TiSkeet LatchMD  12/08/2018, 9:51 AM

## 2018-12-08 NOTE — Plan of Care (Signed)
  Problem: Clinical Measurements: Goal: Respiratory complications will improve Outcome: Progressing Note: FiO2 from 60% to 40% Goal: Cardiovascular complication will be avoided Outcome: Progressing   Problem: Nutrition: Goal: Adequate nutrition will be maintained Outcome: Progressing Note: Dietary consulted

## 2018-12-09 ENCOUNTER — Other Ambulatory Visit: Payer: Self-pay

## 2018-12-09 ENCOUNTER — Inpatient Hospital Stay (HOSPITAL_COMMUNITY): Payer: Medicare Other

## 2018-12-09 ENCOUNTER — Encounter (HOSPITAL_COMMUNITY): Payer: Self-pay | Admitting: *Deleted

## 2018-12-09 DIAGNOSIS — G931 Anoxic brain damage, not elsewhere classified: Secondary | ICD-10-CM

## 2018-12-09 DIAGNOSIS — I5181 Takotsubo syndrome: Principal | ICD-10-CM

## 2018-12-09 LAB — POCT I-STAT 4, (NA,K, GLUC, HGB,HCT)
Glucose, Bld: 142 mg/dL — ABNORMAL HIGH (ref 70–99)
Glucose, Bld: 162 mg/dL — ABNORMAL HIGH (ref 70–99)
HCT: 33 % — ABNORMAL LOW (ref 36.0–46.0)
HCT: 34 % — ABNORMAL LOW (ref 36.0–46.0)
Hemoglobin: 11.2 g/dL — ABNORMAL LOW (ref 12.0–15.0)
Hemoglobin: 11.6 g/dL — ABNORMAL LOW (ref 12.0–15.0)
Potassium: 3.7 mmol/L (ref 3.5–5.1)
Potassium: 3.6 mmol/L (ref 3.5–5.1)
Sodium: 142 mmol/L (ref 135–145)
Sodium: 143 mmol/L (ref 135–145)

## 2018-12-09 LAB — GLUCOSE, CAPILLARY
Glucose-Capillary: 146 mg/dL — ABNORMAL HIGH (ref 70–99)
Glucose-Capillary: 148 mg/dL — ABNORMAL HIGH (ref 70–99)
Glucose-Capillary: 181 mg/dL — ABNORMAL HIGH (ref 70–99)
Glucose-Capillary: 190 mg/dL — ABNORMAL HIGH (ref 70–99)
Glucose-Capillary: 195 mg/dL — ABNORMAL HIGH (ref 70–99)

## 2018-12-09 LAB — CBC WITH DIFFERENTIAL/PLATELET
Abs Immature Granulocytes: 0.13 10*3/uL — ABNORMAL HIGH (ref 0.00–0.07)
Basophils Absolute: 0 10*3/uL (ref 0.0–0.1)
Basophils Relative: 0 %
Eosinophils Absolute: 0 10*3/uL (ref 0.0–0.5)
Eosinophils Relative: 0 %
HCT: 35.2 % — ABNORMAL LOW (ref 36.0–46.0)
Hemoglobin: 11.2 g/dL — ABNORMAL LOW (ref 12.0–15.0)
Immature Granulocytes: 1 %
Lymphocytes Relative: 10 %
Lymphs Abs: 2 10*3/uL (ref 0.7–4.0)
MCH: 29.2 pg (ref 26.0–34.0)
MCHC: 31.8 g/dL (ref 30.0–36.0)
MCV: 91.7 fL (ref 80.0–100.0)
Monocytes Absolute: 1.2 10*3/uL — ABNORMAL HIGH (ref 0.1–1.0)
Monocytes Relative: 6 %
Neutro Abs: 16.1 10*3/uL — ABNORMAL HIGH (ref 1.7–7.7)
Neutrophils Relative %: 83 %
Platelets: 282 10*3/uL (ref 150–400)
RBC: 3.84 MIL/uL — ABNORMAL LOW (ref 3.87–5.11)
RDW: 15.3 % (ref 11.5–15.5)
WBC: 19.5 10*3/uL — ABNORMAL HIGH (ref 4.0–10.5)
nRBC: 0 % (ref 0.0–0.2)

## 2018-12-09 LAB — BASIC METABOLIC PANEL
Anion gap: 8 (ref 5–15)
Anion gap: 5 (ref 5–15)
BUN: 12 mg/dL (ref 8–23)
BUN: 12 mg/dL (ref 8–23)
CO2: 18 mmol/L — ABNORMAL LOW (ref 22–32)
CO2: 21 mmol/L — ABNORMAL LOW (ref 22–32)
Calcium: 7.6 mg/dL — ABNORMAL LOW (ref 8.9–10.3)
Calcium: 7.2 mg/dL — ABNORMAL LOW (ref 8.9–10.3)
Chloride: 116 mmol/L — ABNORMAL HIGH (ref 98–111)
Chloride: 118 mmol/L — ABNORMAL HIGH (ref 98–111)
Creatinine, Ser: 0.53 mg/dL (ref 0.44–1.00)
Creatinine, Ser: 0.57 mg/dL (ref 0.44–1.00)
GFR calc Af Amer: >60 mL/min (ref >60)
GFR calc Af Amer: >60 mL/min (ref >60)
GFR calc non Af Amer: >60 mL/min (ref >60)
GFR calc non Af Amer: >60 mL/min (ref >60)
Glucose, Bld: 138 mg/dL — ABNORMAL HIGH (ref 70–99)
Glucose, Bld: 211 mg/dL — ABNORMAL HIGH (ref 70–99)
Potassium: 3.7 mmol/L (ref 3.5–5.1)
Potassium: 3.1 mmol/L — ABNORMAL LOW (ref 3.5–5.1)
Sodium: 142 mmol/L (ref 135–145)
Sodium: 144 mmol/L (ref 135–145)

## 2018-12-09 LAB — POCT I-STAT, CHEM 8
BUN: 11 mg/dL (ref 8–23)
BUN: 12 mg/dL (ref 8–23)
Calcium, Ion: 1.19 mmol/L (ref 1.15–1.40)
Calcium, Ion: 1.19 mmol/L (ref 1.15–1.40)
Chloride: 114 mmol/L — ABNORMAL HIGH (ref 98–111)
Chloride: 114 mmol/L — ABNORMAL HIGH (ref 98–111)
Creatinine, Ser: 0.3 mg/dL — ABNORMAL LOW (ref 0.44–1.00)
Creatinine, Ser: 0.4 mg/dL — ABNORMAL LOW (ref 0.44–1.00)
Glucose, Bld: 136 mg/dL — ABNORMAL HIGH (ref 70–99)
Glucose, Bld: 151 mg/dL — ABNORMAL HIGH (ref 70–99)
HCT: 35 % — ABNORMAL LOW (ref 36.0–46.0)
HCT: 36 % (ref 36.0–46.0)
Hemoglobin: 11.9 g/dL — ABNORMAL LOW (ref 12.0–15.0)
Hemoglobin: 12.2 g/dL (ref 12.0–15.0)
Potassium: 3.6 mmol/L (ref 3.5–5.1)
Potassium: 3.7 mmol/L (ref 3.5–5.1)
Sodium: 141 mmol/L (ref 135–145)
Sodium: 143 mmol/L (ref 135–145)
TCO2: 17 mmol/L — ABNORMAL LOW (ref 22–32)
TCO2: 17 mmol/L — ABNORMAL LOW (ref 22–32)

## 2018-12-09 LAB — PHOSPHORUS: Phosphorus: 2.6 mg/dL (ref 2.5–4.6)

## 2018-12-09 LAB — MAGNESIUM
Magnesium: 2.3 mg/dL (ref 1.7–2.4)
Magnesium: 2.4 mg/dL (ref 1.7–2.4)

## 2018-12-09 MED ORDER — POTASSIUM CHLORIDE 20 MEQ/15ML (10%) PO SOLN: 20.0000 meq | ORAL | Status: DC

## 2018-12-09 MED ORDER — POTASSIUM CHLORIDE 20 MEQ/15ML (10%) PO SOLN
20.0000 meq | ORAL | Status: AC
  Administered 2018-12-09 – 2018-12-10 (×4): 20 meq
  Filled 2018-12-09 (×4): qty 15

## 2018-12-09 MED ORDER — VITAL AF 1.2 CAL PO LIQD
1000.0000 mL | ORAL | Status: DC
  Administered 2018-12-09 – 2018-12-11 (×3): 1000 mL

## 2018-12-09 MED ORDER — PRO-STAT SUGAR FREE PO LIQD
30.0000 mL | Freq: Two times a day (BID) | ORAL | Status: DC
  Administered 2018-12-09 – 2018-12-11 (×5): 30 mL
  Filled 2018-12-09 (×4): qty 30

## 2018-12-09 NOTE — Progress Notes (Signed)
Nutrition Follow-up  DOCUMENTATION CODES:   Not applicable  INTERVENTION:   Tube feeding: - Vital AF 1.2 @ 40 ml/hr (960 ml/day) via OG tube - Pro-stat 30 ml BID  Recommended tube feeding regimen provides 1352 kcal, 102 grams of protein, and 779 ml of H2O.   NUTRITION DIAGNOSIS:   Inadequate oral intake related to inability to eat as evidenced by NPO status.  Ongoing, being addressed via TF  GOAL:   Patient will meet greater than or equal to 90% of their needs  Met via TF  MONITOR:   Vent status, TF tolerance, Weight trends, I & O's, Labs  REASON FOR ASSESSMENT:   Consult Enteral/tube feeding initiation and management  ASSESSMENT:   84 year old female who presented to the ED on 9/23 after a fall secondary to cardiac arrest. Pt found in asystole by EMS and 15 minutes of CPR performed prior to ROSC. Pt was intubated on arrival. TTM 33 degrees initiated. Pt found to have C1 and C2 fractures and significant left-sided pulmonary infiltrates. PMH of HTN, Alzheimer's.  RD consulted for TF initiation and management. Discussed pt with RN and during ICU rounds. Pt rewarmed at 0920 this AM. Nimbex off.  OG tube in place.  Will utilize weight of 79.3 kg as EDW given positive fluid balance.  Patient is currently intubated on ventilator support MV: 6.8 L/min Temp (24hrs), Avg:94.5 F (34.7 C), Min:91.2 F (32.9 C), Max:98.8 F (37.1 C) BP (a-line): 136/52 MAP (a-line): 82  Drips: Fentanyl: 7.5 ml/hr Levophed: 30 ml/hr NS: 50 ml/hr  Medications reviewed and include: SSI, IV Pepcid, IV abx  Labs reviewed. CBG's: 146-171 x 24 hours  UOP: 380 ml x 24 hours OGT: 20 ml x 24 hours I/O's: +5.6 L since admit  NUTRITION - FOCUSED PHYSICAL EXAM:    Most Recent Value  Orbital Region  No depletion  Upper Arm Region  No depletion  Thoracic and Lumbar Region  Unable to assess  Buccal Region  No depletion  Temple Region  No depletion  Clavicle Bone Region  No depletion   Clavicle and Acromion Bone Region  No depletion  Scapular Bone Region  Unable to assess  Dorsal Hand  No depletion  Patellar Region  No depletion  Anterior Thigh Region  Unable to assess  Posterior Calf Region  No depletion  Edema (RD Assessment)  Moderate [BUE, facial]  Hair  Reviewed  Eyes  Unable to assess  Mouth  Unable to assess  Skin  Reviewed  Nails  Reviewed       Diet Order:   Diet Order    None      EDUCATION NEEDS:   No education needs have been identified at this time  Skin:  Skin Assessment: Reviewed RN Assessment  Last BM:  no documented BM  Height:   Ht Readings from Last 1 Encounters:  11/29/2018 _0  (1.676 m)    Weight:   Wt Readings from Last 1 Encounters:  12/09/18 84.6 kg    Ideal Body Weight:  59.1 kg  BMI:  Body mass index is 30.1 kg/m.  Estimated Nutritional Needs:   Kcal:  1401  Protein:  100-115 grams  Fluid:  >/= 1.5 L    Gaynell Face, MS, RD, LDN Inpatient Clinical Dietitian Pager: [phone] Weekend/After Hours: [phone]

## 2018-12-09 NOTE — Progress Notes (Signed)
Offered spiritual support and prayer to [name] husband, [name], and her daughter.   Will continue to follow-up with [name] and family.  Please page as needed.  Chaplain Darion Juhasz Personal Pager: [phone]

## 2018-12-09 NOTE — Progress Notes (Signed)
Progress Note  Patient Name: Colleen [name] Date of Encounter: 12/09/2018  Primary Cardiologist: No primary care provider on file. new  Subjective   Unable to obtain.  Intubated and sedated. Rewarmed this AM.   Inpatient Medications    Scheduled Meds:  artificial tears  1 application Both Eyes M7E   chlorhexidine gluconate (MEDLINE KIT)  15 mL Mouth Rinse BID   Chlorhexidine Gluconate Cloth  6 each Topical Daily   heparin  5,000 Units Subcutaneous Q8H   insulin aspart  1-3 Units Subcutaneous Q4H   levothyroxine  50 mcg Per Tube Daily   mouth rinse  15 mL Mouth Rinse 10 times per day   sodium chloride flush  10-40 mL Intracatheter Q12H   Continuous Infusions:  sodium chloride 50 mL/hr at 12/09/18 0800   sodium chloride 10 mL/hr at 12/09/18 0800   cefTRIAXone (ROCEPHIN)  IV Stopped (12/09/18 0044)   epinephrine Stopped (12/09/2018 1243)   famotidine (PEPCID) IV Stopped (12/08/18 2334)   fentaNYL infusion INTRAVENOUS 125 mcg/hr (12/09/18 0800)   metronidazole Stopped (12/09/18 7209)   midazolam 2 mg/hr (12/09/18 0804)   norepinephrine (LEVOPHED) Adult infusion 8 mcg/min (12/09/18 0800)   PRN Meds: sodium chloride, fentaNYL, midazolam, neomycin-bacitracin-polymyxin, sodium chloride flush   Vital Signs    Vitals:   12/09/18 0730 12/09/18 0735 12/09/18 0800 12/09/18 0830  BP: (!) 118/53  (!) 125/57 (!) 126/56  Pulse: 78  76 79  Resp: _0 Temp:   98.1 F (36.7 C)   TempSrc:   Bladder   SpO2: 99% 98% 100% 100%  Weight:      Height:        Intake/Output Summary (Last 24 hours) at 12/09/2018 0843 Last data filed at 12/09/2018 0800 Gross per 24 hour  Intake 2966.03 ml  Output 380 ml  Net 2586.03 ml   Last 3 Weights 12/09/2018 12/08/2018 11/27/2018  Weight (lbs) 186 lb 7.4 oz 174 lb 13.2 oz 182 lb 1.6 oz  Weight (kg) 84.58 kg 79.3 kg 82.6 kg  Some encounter information is confidential and restricted. Go to Review Flowsheets activity to see all data.       Telemetry    Sinus rhythm.  SVT, PACs, PVCs - Personally Reviewed  ECG    Sinus rhythm.  Rate 77 bpm.  Anterolateral TWI. Low voltage QRS. - Personally Reviewed  Physical Exam   VS:  BP (!) 126/56    Pulse 79    Temp 98.1 F (36.7 C) (Bladder)    Resp 14    Ht _1  (1.676 m)    Wt 84.6 kg    SpO2 100%    BMI 30.10 kg/m  , BMI Body mass index is 30.1 kg/m.   CVP 5  GENERAL:  Critically ill-appearing.  Intubated and sedated. HEENT: Ecchymosis and trauma to mid face.  Oral mucosa unremarkable NECK:  No jugular venous distention, waveform within normal limits, carotid upstroke brisk and symmetric, no bruits LUNGS:  Clear to auscultation bilaterally on anterior exam.  Vented breath sounds. HEART:  RRR.  PMI not displaced or sustained,S1 and S2 within normal limits, no S3, no S4, no clicks, no rubs, no murmurs ABD:  Flat, positive bowel sounds normal in frequency in pitch, no bruits, no rebound, no guarding, no midline pulsatile mass, no hepatomegaly, no splenomegaly EXT: No edema, no cyanosis no clubbing SKIN:  No rashes no nodules NEURO:  Unable to assess. Intubated and sedated.  PSYCH:  Unable to assess. Intubated  and sedated.    Labs    High Sensitivity Troponin:   Recent Labs  Lab 12/02/2018 1031 11/27/2018 1601 11/19/2018 1723  TROPONINIHS 25* 1,235* 1,279*      Chemistry Recent Labs  Lab 12/08/18 0403  12/08/18 1603  12/09/18 0000 12/09/18 0155 12/09/18 0307 12/09/18 0552  NA 138   < > 137   < > 141 143 142 142  K 5.0   < > 4.2   < > 3.7 3.6 3.7 3.7  CL 112*  --  113*   < > 114* 114* 116*  --   CO2 15*  --  16*  --   --   --  18*  --   GLUCOSE 217*   < > 160*   < > 151* 136* 138* 142*  BUN 13  --  12   < > _0 --   CREATININE 0.67  --  0.50   < > 0.30* 0.40* 0.53  --   CALCIUM 7.5*  --  7.6*  --   --   --  7.6*  --   GFRNONAA >60  --  >60  --   --   --  >60  --   GFRAA >60  --  >60  --   --   --  >60  --   ANIONGAP 11  --  8  --   --   --  8  --      < > = values in this interval not displayed.     Hematology Recent Labs  Lab 11/25/2018 1031  12/08/18 0403  12/09/18 0155 12/09/18 0307 12/09/18 0552  WBC 9.2  --  22.6*  --   --  19.5*  --   RBC 3.92  --  4.33  --   --  3.84*  --   HGB 11.6*   < > 12.9   < > 11.9* 11.2* 11.2*  HCT 36.8   < > 39.4   < > 35.0* 35.2* 33.0*  MCV 93.9  --  91.0  --   --  91.7  --   MCH 29.6  --  29.8  --   --  29.2  --   MCHC 31.5  --  32.7  --   --  31.8  --   RDW 14.5  --  14.6  --   --  15.3  --   PLT 237  --  301  --   --  282  --    < > = values in this interval not displayed.    BNPNo results for input(s): BNP, PROBNP in the last 168 hours.   DDimer No results for input(s): DDIMER in the last 168 hours.   Radiology    Ct Head Wo Contrast  Result Date: 11/22/2018 CLINICAL DATA:  Head trauma EXAM: CT HEAD WITHOUT CONTRAST CT MAXILLOFACIAL WITHOUT CONTRAST CT CERVICAL SPINE WITHOUT CONTRAST TECHNIQUE: Multidetector CT imaging of the head, cervical spine, and maxillofacial structures were performed using the standard protocol without intravenous contrast. Multiplanar CT image reconstructions of the cervical spine and maxillofacial structures were also generated. COMPARISON:  None. FINDINGS: CT HEAD FINDINGS Brain: No evidence of acute infarction, hemorrhage, hydrocephalus, extra-axial collection or mass lesion/mass effect. Mild periventricular white matter hypodensity. Vascular: No hyperdense vessel or unexpected calcification. CT FACIAL BONES FINDINGS Skull: Normal. Negative for fracture or focal lesion. Facial bones: Mildly displaced fractures of the nasal bones. Sinuses/Orbits: No acute finding. Other:  Soft tissue laceration and hematoma of the forehead. Endotracheal and orogastric intubation. CT CERVICAL SPINE FINDINGS Alignment: Normal. Skull base and vertebrae: There are multipart, mildly displaced Jefferson type fractures of C1, with left and right-sided fractures of the anterior arch (series  10, image 10) and two fractures of the midline posterior and left aspect of the posterior arch (series 10, image 16). There is a mildly displaced transverse type II fracture of the dens (series 9, image 25). Soft tissues and spinal canal: No prevertebral fluid or swelling. No visible canal hematoma. Disc levels: There is anterior cervical discectomy and fusion from C4 through C7 with additional bony ankylosis of C3-C4. Upper chest: Extensive heterogeneous opacity of the partially included left pulmonary apex. Partially imaged right pleural effusion with scattered irregular and ground-glass opacity of the right pulmonary apex. Findings may reflect pulmonary contusion or multifocal airspace disease. There is a probable nondisplaced fracture of the anterior left second rib (series 4, image 103). Other: None. IMPRESSION: 1.  No acute intracranial pathology. 2.  Soft tissue laceration and hematoma of the forehead. 3.  Mildly displaced fractures of the nasal bones. 4. There are multipart, mildly displaced Jefferson type fractures of C1, with left and right-sided fractures of the anterior arch (series 10, image 10) and two fractures of the midline posterior and left aspect of the posterior arch (series 10, image 16). 5. There is a mildly displaced transverse type II fracture of the dens (series 9, image 25). 6. There is anterior cervical discectomy and fusion from C4 through C7 with additional bony ankylosis of C3-C4. 7. Extensive heterogeneous opacity of the partially included left pulmonary apex. Partially imaged right pleural effusion with scattered irregular and ground-glass opacity of the right pulmonary apex. Findings may reflect pulmonary contusion or multifocal airspace disease. Consider dedicated imaging of the chest. There is a probable nondisplaced fracture of the anterior left second rib (series 4, image 103). There is no displaced fracture or pneumothorax in the partially imaged portions of the chest. These  results were called by telephone at the time of interpretation on 11/23/2018 at 11:33 a.m. to provider Dr. Vanita Panda, who verbally acknowledged these results. Electronically Signed   By: Eddie Candle M.D.   On: 11/16/2018 11:37   Ct Angio Chest Pe W Or Wo Contrast  Result Date: 12/02/2018 CLINICAL DATA:  Cardiac arrest. Syncopal episode with fall this morning. EXAM: CT ANGIOGRAPHY CHEST WITH CONTRAST TECHNIQUE: Multidetector CT imaging of the chest was performed using the standard protocol during bolus administration of intravenous contrast. Multiplanar CT image reconstructions and MIPs were obtained to evaluate the vascular anatomy. CONTRAST:  59m OMNIPAQUE IOHEXOL 350 MG/ML SOLN COMPARISON:  Chest x-ray dated 12/06/2018 FINDINGS: Cardiovascular: Satisfactory opacification of the pulmonary arteries to the segmental level. No evidence of pulmonary embolism. Normal heart size. No pericardial effusion. Aortic atherosclerosis. Ascending thoracic aorta has a diameter of 3.8 cm. Mediastinum/Nodes: No enlarged mediastinal, hilar, or axillary lymph nodes. Thyroid gland, trachea, and esophagus demonstrate no significant findings. Endotracheal tube tip is 3 cm above the carina. NG tube is in the stomach. Lungs/Pleura: There are extensive consolidative infiltrates in the left upper and lower lobe with left prominent patchy infiltrates in the right upper and lower lobes with slight atelectasis in the right middle lobe. There is some poorly defined nodularity of the infiltrates in the upper lobes bilaterally. Tiny left effusion. Upper Abdomen: No acute abnormality. Musculoskeletal: No acute abnormality. Soft tissues: There is ill-defined increased density in the right supraclavicular soft tissues  suggestive of hemorrhage. Did the patient have attempts at central line insertion in this area? Review of the MIP images confirms the above findings. IMPRESSION: 1. No pulmonary emboli. 2. Extensive bilateral pulmonary infiltrates,  left greater than right. 3. Ill-defined increased density in the right supraclavicular soft tissues suggestive of hemorrhage. Did the patient have attempts at central line insertion in this area. Aortic Atherosclerosis (ICD10-I70.0). Electronically Signed   By: Lorriane Shire M.D.   On: 11/19/2018 15:51   Ct Cervical Spine Wo Contrast  Result Date: 11/29/2018 CLINICAL DATA:  Head trauma EXAM: CT HEAD WITHOUT CONTRAST CT MAXILLOFACIAL WITHOUT CONTRAST CT CERVICAL SPINE WITHOUT CONTRAST TECHNIQUE: Multidetector CT imaging of the head, cervical spine, and maxillofacial structures were performed using the standard protocol without intravenous contrast. Multiplanar CT image reconstructions of the cervical spine and maxillofacial structures were also generated. COMPARISON:  None. FINDINGS: CT HEAD FINDINGS Brain: No evidence of acute infarction, hemorrhage, hydrocephalus, extra-axial collection or mass lesion/mass effect. Mild periventricular white matter hypodensity. Vascular: No hyperdense vessel or unexpected calcification. CT FACIAL BONES FINDINGS Skull: Normal. Negative for fracture or focal lesion. Facial bones: Mildly displaced fractures of the nasal bones. Sinuses/Orbits: No acute finding. Other: Soft tissue laceration and hematoma of the forehead. Endotracheal and orogastric intubation. CT CERVICAL SPINE FINDINGS Alignment: Normal. Skull base and vertebrae: There are multipart, mildly displaced Jefferson type fractures of C1, with left and right-sided fractures of the anterior arch (series 10, image 10) and two fractures of the midline posterior and left aspect of the posterior arch (series 10, image 16). There is a mildly displaced transverse type II fracture of the dens (series 9, image 25). Soft tissues and spinal canal: No prevertebral fluid or swelling. No visible canal hematoma. Disc levels: There is anterior cervical discectomy and fusion from C4 through C7 with additional bony ankylosis of C3-C4.  Upper chest: Extensive heterogeneous opacity of the partially included left pulmonary apex. Partially imaged right pleural effusion with scattered irregular and ground-glass opacity of the right pulmonary apex. Findings may reflect pulmonary contusion or multifocal airspace disease. There is a probable nondisplaced fracture of the anterior left second rib (series 4, image 103). Other: None. IMPRESSION: 1.  No acute intracranial pathology. 2.  Soft tissue laceration and hematoma of the forehead. 3.  Mildly displaced fractures of the nasal bones. 4. There are multipart, mildly displaced Jefferson type fractures of C1, with left and right-sided fractures of the anterior arch (series 10, image 10) and two fractures of the midline posterior and left aspect of the posterior arch (series 10, image 16). 5. There is a mildly displaced transverse type II fracture of the dens (series 9, image 25). 6. There is anterior cervical discectomy and fusion from C4 through C7 with additional bony ankylosis of C3-C4. 7. Extensive heterogeneous opacity of the partially included left pulmonary apex. Partially imaged right pleural effusion with scattered irregular and ground-glass opacity of the right pulmonary apex. Findings may reflect pulmonary contusion or multifocal airspace disease. Consider dedicated imaging of the chest. There is a probable nondisplaced fracture of the anterior left second rib (series 4, image 103). There is no displaced fracture or pneumothorax in the partially imaged portions of the chest. These results were called by telephone at the time of interpretation on 11/22/2018 at 11:33 a.m. to provider Dr. Vanita Panda, who verbally acknowledged these results. Electronically Signed   By: Eddie Candle M.D.   On: 11/20/2018 11:37   Dg Chest Port 1 View  Result Date: 12/09/2018 CLINICAL  DATA:  Endotracheal tube EXAM: PORTABLE CHEST 1 VIEW COMPARISON:  11/25/2018 FINDINGS: The endotracheal tube terminates above the carina.  The OG tube extends below the left hemidiaphragm. There is improved aeration in the left upper lung zone. Scattered airspace opacities are again noted bilaterally. The heart size is stable. Aortic calcifications are noted. There is no pneumothorax. No evidence for an acute displaced fracture. IMPRESSION: 1. Lines and tubes as above. 2. Persistent scattered airspace opacities bilaterally with improved aeration within the left upper lobe. Electronically Signed   By: Constance Holster M.D.   On: 12/09/2018 07:52   Dg Chest Portable 1 View  Result Date: 11/30/2018 CLINICAL DATA:  84 year old female with history of CPR. EXAM: PORTABLE CHEST 1 VIEW COMPARISON:  Chest x-ray 01/17/2016. FINDINGS: The patient is intubated, with the tip of the endotracheal tube at the level of the carina directed toward the right mainstem bronchus. A nasogastric tube is seen extending into the stomach, however, the tip of the nasogastric tube extends below the lower margin of the image. Defibrillation pads project over the upper left abdomen. Patchy multifocal airspace disease in the lungs bilaterally, most confluent in the left upper lobe where there are air bronchograms. No pleural effusions. No evidence of pulmonary edema. Heart size is normal. Right-sided breast implant incidentally noted. Orthopedic fixation hardware in the lower cervical spine incidentally noted. IMPRESSION: 1. Low lying endotracheal tube which should be retracted approximately 4.4 cm for more optimal placement at the level of the thoracic inlet. Per report from the technologist, the MD caring for the patient saw the low-lying tube and pulled the endotracheal tube back immediately after the examination. Attention on follow-up study is recommended. 2. Multilobar pneumonia, most severe in the left upper lobe. 3. Additional support apparatus, as above. Electronically Signed   By: Vinnie Langton M.D.   On: 12/13/2018 10:47   Ct Maxillofacial Wo Contrast  Result  Date: 11/19/2018 CLINICAL DATA:  Head trauma EXAM: CT HEAD WITHOUT CONTRAST CT MAXILLOFACIAL WITHOUT CONTRAST CT CERVICAL SPINE WITHOUT CONTRAST TECHNIQUE: Multidetector CT imaging of the head, cervical spine, and maxillofacial structures were performed using the standard protocol without intravenous contrast. Multiplanar CT image reconstructions of the cervical spine and maxillofacial structures were also generated. COMPARISON:  None. FINDINGS: CT HEAD FINDINGS Brain: No evidence of acute infarction, hemorrhage, hydrocephalus, extra-axial collection or mass lesion/mass effect. Mild periventricular white matter hypodensity. Vascular: No hyperdense vessel or unexpected calcification. CT FACIAL BONES FINDINGS Skull: Normal. Negative for fracture or focal lesion. Facial bones: Mildly displaced fractures of the nasal bones. Sinuses/Orbits: No acute finding. Other: Soft tissue laceration and hematoma of the forehead. Endotracheal and orogastric intubation. CT CERVICAL SPINE FINDINGS Alignment: Normal. Skull base and vertebrae: There are multipart, mildly displaced Jefferson type fractures of C1, with left and right-sided fractures of the anterior arch (series 10, image 10) and two fractures of the midline posterior and left aspect of the posterior arch (series 10, image 16). There is a mildly displaced transverse type II fracture of the dens (series 9, image 25). Soft tissues and spinal canal: No prevertebral fluid or swelling. No visible canal hematoma. Disc levels: There is anterior cervical discectomy and fusion from C4 through C7 with additional bony ankylosis of C3-C4. Upper chest: Extensive heterogeneous opacity of the partially included left pulmonary apex. Partially imaged right pleural effusion with scattered irregular and ground-glass opacity of the right pulmonary apex. Findings may reflect pulmonary contusion or multifocal airspace disease. There is a probable nondisplaced fracture of the  anterior left second  rib (series 4, image 103). Other: None. IMPRESSION: 1.  No acute intracranial pathology. 2.  Soft tissue laceration and hematoma of the forehead. 3.  Mildly displaced fractures of the nasal bones. 4. There are multipart, mildly displaced Jefferson type fractures of C1, with left and right-sided fractures of the anterior arch (series 10, image 10) and two fractures of the midline posterior and left aspect of the posterior arch (series 10, image 16). 5. There is a mildly displaced transverse type II fracture of the dens (series 9, image 25). 6. There is anterior cervical discectomy and fusion from C4 through C7 with additional bony ankylosis of C3-C4. 7. Extensive heterogeneous opacity of the partially included left pulmonary apex. Partially imaged right pleural effusion with scattered irregular and ground-glass opacity of the right pulmonary apex. Findings may reflect pulmonary contusion or multifocal airspace disease. Consider dedicated imaging of the chest. There is a probable nondisplaced fracture of the anterior left second rib (series 4, image 103). There is no displaced fracture or pneumothorax in the partially imaged portions of the chest. These results were called by telephone at the time of interpretation on 11/20/2018 at 11:33 a.m. to provider Dr. Vanita Panda, who verbally acknowledged these results. Electronically Signed   By: Eddie Candle M.D.   On: 11/15/2018 11:37    Cardiac Studies   Echo 11/27/2018: IMPRESSIONS   1. Left ventricular ejection fraction, by visual estimation, is 40 to 45%. The left ventricle has normal function. Normal left ventricular size. There is no left ventricular hypertrophy.  2. Left ventricular diastolic Doppler parameters are consistent with impaired relaxation pattern of LV diastolic filling.  3. Akinesis of the mid to apical anterior, lateral and inferior walls with basilar sparing. Apprearance of Takotsubo.     LVOT hypercontractile - peak velocity 2.58ms, mean  gradient 273mg.  4. Global right ventricle has normal systolic function.The right ventricular size is normal. No increase in right ventricular wall thickness.  5. Left atrial size was normal.  6. Right atrial size was normal.  7. Mild mitral annular calcification.  8. The mitral valve is normal in structure. No evidence of mitral valve regurgitation. No evidence of mitral stenosis.  9. The tricuspid valve is normal in structure. Tricuspid valve regurgitation was not visualized by color flow Doppler. 10. The aortic valve is normal in structure. Aortic valve regurgitation is mild by color flow Doppler. Mild aortic valve sclerosis without stenosis. 11. The pulmonic valve was normal in structure. Pulmonic valve regurgitation is not visualized by color flow Doppler. 12. The inferior vena cava is normal in size with greater than 50% respiratory variability, suggesting right atrial pressure of 3 mmHg.  Patient Profile     [age].o. female with hypertension, Alzheimer's disease, and hypothyroidism here with out of hospital cardiac arrest.  Assessment & Plan    # Cardiac arrest:  Initial rhythm asystole.  Echo showed Takotsubo cardiomyopathy.  Family reports she was anxious and perseverating about an upcoming surgery.  CXR also concerning for pneumonia, though she had no symptoms of pneumonia prior to the event.  She has been rewarmed this AM.  Weaning sedation.  Will await neurologic assessment post cooling.  Overall prognosis poor given her age, underlying conditions and the time she was down.  Psych meds on hold 2/2 QT prolongation.  # Takosubo cardiomyopathy: As above.  Will need to add beta blocker/ARB when BP allows.   # Hypertension:  Home meds on hold.   Time spent: 3527  minutes-Greater than 50% of this time was spent in counseling, explanation of diagnosis, planning of further management, and coordination of care.      For questions or updates, please contact [place] Please  consult [email or web address] for contact info under        Signed, Skeet Latch, MD  12/09/2018, 8:43 AM

## 2018-12-09 NOTE — Progress Notes (Signed)
NAME:  Colleen [name], MRN:  [id number], DOB:  1934-06-19, LOS: 2 ADMISSION DATE:  12/10/2018, CONSULTATION DATE:  9/23 REFERRING MD:  Vanita Panda, CHIEF COMPLAINT:  Cardiac arrest   Brief History   84 y/o F who presented to [facility] on 9/23 after an unwitnessed fall at home, found to be in asystole requiring 15 min of EMS CPR prior to [place].  Intubated on arrival, imaging on admission with C1 and C2 fractures, & significant left sided pulmonary infiltrates.   History of present illness   84 yo F who presented to [facility] on 9/23 after an unwitnessed fall at home, found by husband face down with pool of blood under her head. On arrival EMS found patient pulseless in asystole. The patient required 15 min of CPR prior to ROSC achieved.    Patients daughter states that patient was at home with husband when she attempted to ambulate to the restroom at which time she suffered the fall. Patient fell forward striking her head with no signs of attempts to prevent fall. Daughter stated that patients husband did not witness the fall. Daughter reported patient has been suffering from multiple falls recently, last major fall was spring of this year that resulted in fracture to left small finger.   Initial ER evaluation notable for hypotension, tachycardia and hypothermia. Labwork on admission with elevated troponin and lactic acid. ABG 7.25 / 42 / 150 / 19.  Imaging on admission including CT cervical spine, head, and maxillfacial revealed C1 multipart Jefferson type fracture, mildly displaced transverse type II fracture to C2. The patient was intubated in the ER for airway protection.    PCCM called for ICU admission.   Past Medical History  Hypertension Hypothyroidism Heart Murmur  Right breast cancer Mitral valve disorder Osteoporosis   Significant Hospital Events   9/23 Admitted post 15 min CPR, TTM initiated  Consults:  Neurosurgery  Neurology cardiology  Procedures:  Lleft femoral central line 9/23 >>   Significant Diagnostic Tests:  9/23  CT Cervical Spine, Head, and Maxillfacial revealed C1 multipart Jefferson type fracture, mildly displaced transverse type II fracture to C2, multiple nasal fractures, pulmonary contusions   9/23 echo: LVEF 40-45%. Akinesis of the mid to apical anterior, lateral and inferior walls with basilar sparing. Apprearance of Takotsubo. No valvular abnormalities noted  Micro Data:  COVID 9/23 >> neg BCx 9/23 >>  Antimicrobials:  Azithromycin 9/23 x1 dose Ceftriaxone 9/23 >> Flagyl 9/23 >>  Interim history/subjective:  Intubated. Sedated. Paralytic turned off this morning. Reached 37C this morning.   Objective   Blood pressure (!) 127/58, pulse 77, temperature 98.6 F (37 C), resp. rate 14, height 5\' 6"  (1.676 m), weight 84.6 kg, SpO2 100 %. CVP:  [2 mmHg-9 mmHg] 5 mmHg  Vent Mode: PRVC FiO2 (%):  [40 %] 40 % Set Rate:  [14 bmp] 14 bmp Vt Set:  [470 mL] 470 mL PEEP:  [5 cmH20] 5 cmH20 Plateau Pressure:  [20 cmH20-21 cmH20] 21 cmH20   Intake/Output Summary (Last 24 hours) at 12/09/2018 0923 Last data filed at 12/09/2018 0800 Gross per 24 hour  Intake 2966.03 ml  Output 415 ml  Net 2551.03 ml   Filed Weights   11/17/2018 1800 12/08/18 0400 12/09/18 0300  Weight: 82.6 kg 79.3 kg 84.6 kg    Examination: General: acutely ill appearing. Arctic sun in place HEENT: ETT. C collar on. Neuro: sedated. No pupillary reflex. No corneal reflex. No gag reflex. CV: RRR. No peripheral edema appreciated. PULM:  Course breath  sounds bilaterally GI: bowel sounds active. abd soft Extremities: no peripheral edema Skin: laceration to forehead. No active bleeding from site. Hematomas on face  Resolved Hospital Problem list      Assessment & Plan:   OOH Asystolic Cardiac arrest. Time to ROSC 15 min. Etiology unclear. Initial EKG significant for new RBBB and QT prolongation. Pt is on several potential offending agents at home for the QT prolongation. Trops trending  up: 25-->1200-->1300.  Echo findings consistent with Takotsubo CM.  CTA neg for PE.  P: Will continue to hold psych meds due to QT prolongation No heparin IV due to head trama Further workup pending neurologic recovery Continue levophed for BP support  Acute Hypoxic Respiratory failure Left Upper Lobe Infiltrates, Suspected Aspiration -Post cardiac arrest  -not a candidate for wean at this time. -white count trending down P: continue roceph, flagyl. VAP protocol.  Acute on chronic encephalopathy s/p cardiac arrest Neuro on board EEG findings suggest profound encephalopathy consistent with diffuse anoxic vs hypoxic brain injury. No seizure patterns noted. Given her age, prolonged downtime without CPR and baseline comorbidities, patient has poor prognosis. I discussed goals of care with patient's husband and children yesterday. They were clear that she would not want to remain in an incapacitated state. We discussed that further neuro-prognosication will follow rewarming and sedation/paralytic wean. They elected to make her DNR. They planned to hold family discussion last evening. Plan: Continue TTM protocol. Sedation being weaned. Paralytic off at 0500. K 3.6 this morning. Continue to monitor labs. Await re-warming for neuro prognostication  Will continue conversation with them today when they arrive on unit.  Traumatic fracture C1 and C2 s/p fall/cardiac arrest Traumatic multiple nasal bone fractures  neurosurg on board P: Maintain ridge C-collar. neurosurg will hold off on further management due to anoxic brain injury. Ensure adequate pain control   Hypothyroidism. Continue synthroid Dementia/Chronic Back Pain. Hold home namenda, aricept, zyprexa, fluvoxamine, neurontin Hx HTN. Hold home lasix, benicar  Best practice:  Diet: NPO Pain/Anxiety/Delirium protocol (if indicated): Fentanyl and Versed drip  VAP protocol (if indicated): In place  DVT prophylaxis: heparin  GI  prophylaxis: pepcid  Glucose control: N/A Mobility: Bedrest  Code Status: DNR Family Communication: family decided on DNR yesterday. Will continue discussion regarding goals of care today   Disposition: Cardiovascular ICU  Mitzi Hansen, MD Internal Medicine Resident PGY1 12/09/18 9:23 AM

## 2018-12-09 NOTE — Progress Notes (Signed)
Reached 36 degree C at 0510, Nimbex turned off at that time. 12/09/18 5:25 AM

## 2018-12-10 ENCOUNTER — Inpatient Hospital Stay (HOSPITAL_COMMUNITY): Payer: Medicare Other

## 2018-12-10 LAB — HEPATIC FUNCTION PANEL
ALT: 61 U/L — ABNORMAL HIGH (ref 0–44)
AST: 53 U/L — ABNORMAL HIGH (ref 15–41)
Albumin: 2 g/dL — ABNORMAL LOW (ref 3.5–5.0)
Alkaline Phosphatase: 67 U/L (ref 38–126)
Bilirubin, Direct: <0.1 mg/dL (ref 0.0–0.2)
Total Bilirubin: <0.1 mg/dL — ABNORMAL LOW (ref 0.3–1.2)
Total Protein: 4.4 g/dL — ABNORMAL LOW (ref 6.5–8.1)

## 2018-12-10 LAB — BASIC METABOLIC PANEL
Anion gap: 7 (ref 5–15)
BUN: 17 mg/dL (ref 8–23)
CO2: 25 mmol/L (ref 22–32)
Calcium: 7.3 mg/dL — ABNORMAL LOW (ref 8.9–10.3)
Chloride: 116 mmol/L — ABNORMAL HIGH (ref 98–111)
Creatinine, Ser: 0.56 mg/dL (ref 0.44–1.00)
GFR calc Af Amer: >60 mL/min (ref >60)
GFR calc non Af Amer: >60 mL/min (ref >60)
Glucose, Bld: 175 mg/dL — ABNORMAL HIGH (ref 70–99)
Potassium: 2.5 mmol/L — CL (ref 3.5–5.1)
Sodium: 148 mmol/L — ABNORMAL HIGH (ref 135–145)

## 2018-12-10 LAB — PROCALCITONIN: Procalcitonin: 0.67 ng/mL

## 2018-12-10 LAB — CBC
HCT: 27.8 % — ABNORMAL LOW (ref 36.0–46.0)
Hemoglobin: 9 g/dL — ABNORMAL LOW (ref 12.0–15.0)
MCH: 29.6 pg (ref 26.0–34.0)
MCHC: 32.4 g/dL (ref 30.0–36.0)
MCV: 91.4 fL (ref 80.0–100.0)
Platelets: 173 10*3/uL (ref 150–400)
RBC: 3.04 MIL/uL — ABNORMAL LOW (ref 3.87–5.11)
RDW: 15.8 % — ABNORMAL HIGH (ref 11.5–15.5)
WBC: 12.1 10*3/uL — ABNORMAL HIGH (ref 4.0–10.5)
nRBC: 0 % (ref 0.0–0.2)

## 2018-12-10 LAB — GLUCOSE, CAPILLARY
Glucose-Capillary: 163 mg/dL — ABNORMAL HIGH (ref 70–99)
Glucose-Capillary: 165 mg/dL — ABNORMAL HIGH (ref 70–99)
Glucose-Capillary: 170 mg/dL — ABNORMAL HIGH (ref 70–99)
Glucose-Capillary: 160 mg/dL — ABNORMAL HIGH (ref 70–99)
Glucose-Capillary: 170 mg/dL — ABNORMAL HIGH (ref 70–99)

## 2018-12-10 LAB — PHOSPHORUS
Phosphorus: 2.1 mg/dL — ABNORMAL LOW (ref 2.5–4.6)
Phosphorus: 1.6 mg/dL — ABNORMAL LOW (ref 2.5–4.6)

## 2018-12-10 LAB — MAGNESIUM
Magnesium: 2.5 mg/dL — ABNORMAL HIGH (ref 1.7–2.4)
Magnesium: 2.4 mg/dL (ref 1.7–2.4)

## 2018-12-10 LAB — POTASSIUM: Potassium: 4.7 mmol/L (ref 3.5–5.1)

## 2018-12-10 MED ORDER — POTASSIUM CHLORIDE 10 MEQ/50ML IV SOLN
10.0000 meq | INTRAVENOUS | Status: AC
  Administered 2018-12-10 (×4): 10 meq via INTRAVENOUS
  Filled 2018-12-10 (×4): qty 50

## 2018-12-10 MED ORDER — POTASSIUM CHLORIDE 20 MEQ/15ML (10%) PO SOLN
40.0000 meq | ORAL | Status: AC
  Administered 2018-12-10 (×2): 40 meq via ORAL
  Filled 2018-12-10 (×2): qty 30

## 2018-12-10 NOTE — Progress Notes (Signed)
   She has been rewarmed and is off sedation.  Not responsive.  We will follow.  Critical care to talk to family today. Please call with questions.

## 2018-12-10 NOTE — Progress Notes (Signed)
NAME:  Colleen [name], MRN:  [id number], DOB:  [birth year], LOS: 3 ADMISSION DATE:  11/27/2018, CONSULTATION DATE:  9/23 REFERRING MD:  Vanita Panda, CHIEF COMPLAINT:  Cardiac arrest   Brief History   84 y/o F who presented to [facility] on 9/23 after an unwitnessed fall at home, found to be in asystole requiring 15 min of EMS CPR prior to [place].  Intubated on arrival, imaging on admission with C1 and C2 fractures, & significant left sided pulmonary infiltrates.   History of present illness   84 yo F who presented to [facility] on 9/23 after an unwitnessed fall at home, found by husband face down with pool of blood under her head. On arrival EMS found patient pulseless in asystole. The patient required 15 min of CPR prior to ROSC achieved.    Patients daughter states that patient was at home with husband when she attempted to ambulate to the restroom at which time she suffered the fall. Patient fell forward striking her head with no signs of attempts to prevent fall. Daughter stated that patients husband did not witness the fall. Daughter reported patient has been suffering from multiple falls recently, last major fall was spring of this year that resulted in fracture to left small finger.   Initial ER evaluation notable for hypotension, tachycardia and hypothermia. Labwork on admission with elevated troponin and lactic acid. ABG 7.25 / 42 / 150 / 19.  Imaging on admission including CT cervical spine, head, and maxillfacial revealed C1 multipart Jefferson type fracture, mildly displaced transverse type II fracture to C2. The patient was intubated in the ER for airway protection.    PCCM called for ICU admission.   Past Medical History  Hypertension Hypothyroidism Heart Murmur  Right breast cancer Mitral valve disorder Osteoporosis   Significant Hospital Events   9/23 Admitted post 15 min CPR, TTM initiated  Consults:  Neurosurgery  Neurology cardiology  Procedures:  Lleft femoral central line 9/23 >>   Significant Diagnostic Tests:  9/23  CT Cervical Spine, Head, and Maxillfacial revealed C1 multipart Jefferson type fracture, mildly displaced transverse type II fracture to C2, multiple nasal fractures, pulmonary contusions   9/23 echo: LVEF 40-45%. Akinesis of the mid to apical anterior, lateral and inferior walls with basilar sparing. Apprearance of Takotsubo. No valvular abnormalities noted  Micro Data:  COVID 9/23 >> neg BCx 9/23 >>  Antimicrobials:  Azithromycin 9/23 x1 dose Ceftriaxone 9/23 >> Flagyl 9/23 >>  Interim history/subjective:  No events, remains comatose.  Objective   Blood pressure (!) 128/56, pulse 61, temperature 98.8 F (37.1 C), temperature source Bladder, resp. rate 11, height 5\' 6"  (1.676 m), weight 87.3 kg, SpO2 96 %. CVP:  [8 mmHg-13 mmHg] 9 mmHg  Vent Mode: PRVC FiO2 (%):  [40 %] 40 % Set Rate:  [14 bmp] 14 bmp Vt Set:  [470 mL] 470 mL PEEP:  [5 cmH20] 5 cmH20 Plateau Pressure:  [21 cmH20-22 cmH20] 21 cmH20   Intake/Output Summary (Last 24 hours) at 12/10/2018 0834 Last data filed at 12/10/2018 0800 Gross per 24 hour  Intake 2718.87 ml  Output 770 ml  Net 1948.87 ml   Filed Weights   12/08/18 0400 12/09/18 0300 12/10/18 0330  Weight: 79.3 kg 84.6 kg 87.3 kg    GEN: unresponsive elderly woman on vent HEENT: head laceration and peri-orbital swelling/echymmoses, ETT in place with mild secretion burden, aspen collar in place CV: ext warm, no murmurs, RRR PULM: Clear, no accessory muscle use GI: Soft, hypoactive BS EXT:  No edema NEURO: pupils pinpoint bilaterally, cannot get clear pupillary response, no corneal reflex, no gag or cough reflex, triggers vent at ~6 breaths/min, GCS3T PSYCH: cannot assess SKIN: no rashes   Resolved Hospital Problem list      Assessment & Plan:   # OOH Asystolic Cardiac arrest. # Likely aspiration event due to above # Likely stress myocardial ischemia (type 2) and cardiomyopathy  # Suspicion for severe  anoxic brain injury- based on exam and EEG, does not meet criteria for brain death # Hypothyroid hx- TSH okay  - Head CT looking for grey-white differentiation changes that would be c/w significant anoxic injury - Goals of care discussion with family - Replete K - Check add on Pct, if < 0.5, dc abx - Check add on LFTs, replete Ca if corrected Ca is low - Continue usual supportive care including vent for now  Best practice:  Diet: NPO Pain/Anxiety/Delirium protocol (if indicated): all sedation off VAP protocol (if indicated): In place  DVT prophylaxis: heparin  GI prophylaxis: pepcid  Glucose control: N/A Mobility: Bedrest  Code Status: DNR Family Communication: will speak with today Disposition: ICU    The patient is critically ill with multiple organ systems failure and requires high complexity decision making for assessment and support, frequent evaluation and titration of therapies, application of advanced monitoring technologies and extensive interpretation of multiple databases. Critical Care Time devoted to patient care services described in this note independent of APP/resident time (if applicable)  is 32 minutes.   Erskine Emery MD [place] [facility] 12/10/2018 9:06 AM Personal pager: [phone] If unanswered, please page CCM On-call: [phone]

## 2018-12-10 NOTE — Plan of Care (Signed)
  Problem: Education: Goal: Knowledge of General Education information will improve Description Including pain rating scale, medication(s)/side effects and non-pharmacologic comfort measures Outcome: Not Progressing   Problem: Health Behavior/Discharge Planning: Goal: Ability to manage health-related needs will improve Outcome: Not Progressing   Problem: Clinical Measurements: Goal: Ability to maintain clinical measurements within normal limits will improve Outcome: Not Progressing Goal: Will remain free from infection Outcome: Not Progressing Goal: Diagnostic test results will improve Outcome: Not Progressing Goal: Respiratory complications will improve Outcome: Not Progressing Goal: Cardiovascular complication will be avoided Outcome: Not Progressing   Problem: Activity: Goal: Risk for activity intolerance will decrease Outcome: Not Progressing   Problem: Nutrition: Goal: Adequate nutrition will be maintained Outcome: Not Progressing   Problem: Coping: Goal: Level of anxiety will decrease Outcome: Not Progressing   Problem: Elimination: Goal: Will not experience complications related to bowel motility Outcome: Not Progressing Goal: Will not experience complications related to urinary retention Outcome: Not Progressing   Problem: Pain Managment: Goal: General experience of comfort will improve Outcome: Not Progressing   Problem: Safety: Goal: Ability to remain free from injury will improve Outcome: Not Progressing   Problem: Skin Integrity: Goal: Risk for impaired skin integrity will decrease Outcome: Not Progressing   Problem: Education: Goal: Ability to manage disease process will improve Outcome: Not Progressing   Problem: Cardiac: Goal: Ability to achieve and maintain adequate cardiopulmonary perfusion will improve Outcome: Not Progressing   Problem: Neurologic: Goal: Promote progressive neurologic recovery Outcome: Not Progressing   Problem: Skin  Integrity: Goal: Risk for impaired skin integrity will be minimized. Outcome: Not Progressing   

## 2018-12-11 ENCOUNTER — Inpatient Hospital Stay (HOSPITAL_COMMUNITY): Payer: Medicare Other

## 2018-12-11 LAB — BASIC METABOLIC PANEL
Anion gap: 6 (ref 5–15)
BUN: 24 mg/dL — ABNORMAL HIGH (ref 8–23)
CO2: 25 mmol/L (ref 22–32)
Calcium: 7.9 mg/dL — ABNORMAL LOW (ref 8.9–10.3)
Chloride: 120 mmol/L — ABNORMAL HIGH (ref 98–111)
Creatinine, Ser: 0.53 mg/dL (ref 0.44–1.00)
GFR calc Af Amer: >60 mL/min (ref >60)
GFR calc non Af Amer: >60 mL/min (ref >60)
Glucose, Bld: 165 mg/dL — ABNORMAL HIGH (ref 70–99)
Potassium: 3.4 mmol/L — ABNORMAL LOW (ref 3.5–5.1)
Sodium: 151 mmol/L — ABNORMAL HIGH (ref 135–145)

## 2018-12-11 LAB — GLUCOSE, CAPILLARY
Glucose-Capillary: 140 mg/dL — ABNORMAL HIGH (ref 70–99)
Glucose-Capillary: 151 mg/dL — ABNORMAL HIGH (ref 70–99)
Glucose-Capillary: 152 mg/dL — ABNORMAL HIGH (ref 70–99)
Glucose-Capillary: 161 mg/dL — ABNORMAL HIGH (ref 70–99)
Glucose-Capillary: 178 mg/dL — ABNORMAL HIGH (ref 70–99)

## 2018-12-11 LAB — CBC WITH DIFFERENTIAL/PLATELET
Abs Immature Granulocytes: 0.11 10*3/uL — ABNORMAL HIGH (ref 0.00–0.07)
Basophils Absolute: 0 10*3/uL (ref 0.0–0.1)
Basophils Relative: 0 %
Eosinophils Absolute: 0 10*3/uL (ref 0.0–0.5)
Eosinophils Relative: 0 %
HCT: 29.6 % — ABNORMAL LOW (ref 36.0–46.0)
Hemoglobin: 9.1 g/dL — ABNORMAL LOW (ref 12.0–15.0)
Immature Granulocytes: 1 %
Lymphocytes Relative: 14 %
Lymphs Abs: 1.7 10*3/uL (ref 0.7–4.0)
MCH: 29 pg (ref 26.0–34.0)
MCHC: 30.7 g/dL (ref 30.0–36.0)
MCV: 94.3 fL (ref 80.0–100.0)
Monocytes Absolute: 0.8 10*3/uL (ref 0.1–1.0)
Monocytes Relative: 6 %
Neutro Abs: 9.8 10*3/uL — ABNORMAL HIGH (ref 1.7–7.7)
Neutrophils Relative %: 79 %
Platelets: 172 10*3/uL (ref 150–400)
RBC: 3.14 MIL/uL — ABNORMAL LOW (ref 3.87–5.11)
RDW: 16.5 % — ABNORMAL HIGH (ref 11.5–15.5)
WBC: 12.4 10*3/uL — ABNORMAL HIGH (ref 4.0–10.5)
nRBC: 0.2 % (ref 0.0–0.2)

## 2018-12-11 LAB — PHOSPHORUS: Phosphorus: 2 mg/dL — ABNORMAL LOW (ref 2.5–4.6)

## 2018-12-11 LAB — MAGNESIUM: Magnesium: 2.5 mg/dL — ABNORMAL HIGH (ref 1.7–2.4)

## 2018-12-11 LAB — POTASSIUM: Potassium: 3.6 mmol/L (ref 3.5–5.1)

## 2018-12-11 MED ORDER — MORPHINE SULFATE (PF) 2 MG/ML IV SOLN: 2.0000 mg | INTRAVENOUS | Status: DC | PRN

## 2018-12-11 MED ORDER — MIDAZOLAM HCL 2 MG/2ML IJ SOLN
4.0000 mg | Freq: Once | INTRAMUSCULAR | Status: AC
  Administered 2018-12-11: 2 mg via INTRAVENOUS
  Filled 2018-12-11: qty 4

## 2018-12-11 MED ORDER — POLYVINYL ALCOHOL 1.4 % OP SOLN
1.0000 [drp] | Freq: Four times a day (QID) | OPHTHALMIC | Status: DC | PRN
  Filled 2018-12-11: qty 15

## 2018-12-11 MED ORDER — GLYCOPYRROLATE 0.2 MG/ML IJ SOLN: 0.2000 mg | INTRAMUSCULAR | Status: DC | PRN

## 2018-12-11 MED ORDER — DEXTROSE 5 % IV SOLN: INTRAVENOUS | Status: DC

## 2018-12-11 MED ORDER — MORPHINE 100MG IN NS 100ML (1MG/ML) PREMIX INFUSION
0.0000 mg/h | INTRAVENOUS | Status: DC
  Filled 2018-12-11: qty 100

## 2018-12-11 MED ORDER — GLYCOPYRROLATE 1 MG PO TABS
1.0000 mg | ORAL_TABLET | ORAL | Status: DC | PRN
  Filled 2018-12-11: qty 1

## 2018-12-11 MED ORDER — DIPHENHYDRAMINE HCL 50 MG/ML IJ SOLN: 25.0000 mg | INTRAMUSCULAR | Status: DC | PRN

## 2018-12-11 MED ORDER — FREE WATER
300.0000 mL | Freq: Four times a day (QID) | Status: DC
  Administered 2018-12-11 (×2): 300 mL

## 2018-12-11 MED ORDER — ACETAMINOPHEN 650 MG RE SUPP: 650.0000 mg | Freq: Four times a day (QID) | RECTAL | Status: DC | PRN

## 2018-12-11 MED ORDER — ACETAMINOPHEN 325 MG PO TABS: 650.0000 mg | ORAL_TABLET | Freq: Four times a day (QID) | ORAL | Status: DC | PRN

## 2018-12-11 MED ORDER — MORPHINE BOLUS VIA INFUSION
5.0000 mg | INTRAVENOUS | Status: DC | PRN
  Filled 2018-12-11: qty 5

## 2018-12-11 MED ORDER — POTASSIUM PHOSPHATES 15 MMOLE/5ML IV SOLN
20.0000 mmol | Freq: Once | INTRAVENOUS | Status: AC
  Administered 2018-12-11: 20 mmol via INTRAVENOUS
  Filled 2018-12-11: qty 6.67

## 2018-12-12 LAB — CULTURE, BLOOD (ROUTINE X 2)
Culture: NO GROWTH
Culture: NO GROWTH
Special Requests: ADEQUATE

## 2018-12-15 NOTE — Progress Notes (Signed)
Family meeting note  Discussed findings of MRI in context of clinical exam and no meaningful chance of functional status.  Family would like everyone to come in to say goodbye and would like patient to be allowed to pass in peace without IV medicines or mechanical ventilation.  Erskine Emery MD

## 2018-12-15 NOTE — Discharge Summary (Signed)
Date of Admission: 11/17/2018 Date of Death: 27-Dec-2018 Diagnoses: Cardiac arrest Hospital Course: 84 year old woman who presented with prolonged out of hospital cardiac arrest. Despite cooling and aggressive support it became apparent she suffered severe anoxic brain injury.  Family meeting held and in light of prognosis and patient's previously stated wishes, she was taken off life support and allowed to pass in peace.  Erskine Emery MD PCCM

## 2018-12-15 NOTE — Progress Notes (Signed)
Pt transported to [facility] via vent with no complications noted.

## 2018-12-15 NOTE — Progress Notes (Signed)
[place] Progress Note Patient Name: Aman Nourse DOB: Dec 01, 1934 MRN: [id number]   Date of Service  20-Dec-2018  HPI/Events of Note  AM labs  eICU Interventions  Ordered.      Intervention Category Minor Interventions: Routine modifications to care plan (e.g. PRN medications for pain, fever)  Elmer Sow 12-20-2018, 3:11 AM

## 2018-12-15 NOTE — Progress Notes (Signed)
NAME:  Colleen [name], MRN:  [id number], DOB:  June 21, 1934, LOS: 4 ADMISSION DATE:  12/06/2018, CONSULTATION DATE:  9/23 REFERRING MD:  Vanita Panda, CHIEF COMPLAINT:  Cardiac arrest   Brief History   84 y/o F who presented to [facility] on 9/23 after an unwitnessed fall at home, found to be in asystole requiring 15 min of EMS CPR prior to [place].  Intubated on arrival, imaging on admission with C1 and C2 fractures, & significant left sided pulmonary infiltrates.   History of present illness   84 yo F who presented to [facility] on 9/23 after an unwitnessed fall at home, found by husband face down with pool of blood under her head. On arrival EMS found patient pulseless in asystole. The patient required 15 min of CPR prior to ROSC achieved.    Patients daughter states that patient was at home with husband when she attempted to ambulate to the restroom at which time she suffered the fall. Patient fell forward striking her head with no signs of attempts to prevent fall. Daughter stated that patients husband did not witness the fall. Daughter reported patient has been suffering from multiple falls recently, last major fall was spring of this year that resulted in fracture to left small finger.   Initial ER evaluation notable for hypotension, tachycardia and hypothermia. Labwork on admission with elevated troponin and lactic acid. ABG 7.25 / 42 / 150 / 19.  Imaging on admission including CT cervical spine, head, and maxillfacial revealed C1 multipart Jefferson type fracture, mildly displaced transverse type II fracture to C2. The patient was intubated in the ER for airway protection.    PCCM called for ICU admission.   Past Medical History  Hypertension Hypothyroidism Heart Murmur  Right breast cancer Mitral valve disorder Osteoporosis   Significant Hospital Events   9/23 Admitted post 15 min CPR, TTM initiated  Consults:  Neurosurgery  Neurology cardiology  Procedures:  Lleft femoral central line 9/23 >>   Significant Diagnostic Tests:  9/23  CT Cervical Spine, Head, and Maxillfacial revealed C1 multipart Jefferson type fracture, mildly displaced transverse type II fracture to C2, multiple nasal fractures, pulmonary contusions   9/23 echo: LVEF 40-45%. Akinesis of the mid to apical anterior, lateral and inferior walls with basilar sparing. Apprearance of Takotsubo. No valvular abnormalities noted  12/10/18 Repeat CT head, read as no changes, grey-white look a little more blurred to me  Micro Data:  COVID 9/23 >> neg BCx 9/23 >>  Antimicrobials:  Azithromycin 9/23 x1 dose Ceftriaxone 9/23 >>  Interim history/subjective:  No events, remains comatose.  Objective   Blood pressure (!) 130/59, pulse 66, temperature 98.8 F (37.1 C), resp. rate 14, height 5\' 6"  (1.676 m), weight 90.6 kg, SpO2 97 %. CVP:  [6 mmHg-18 mmHg] 11 mmHg  Vent Mode: PRVC FiO2 (%):  [40 %] 40 % Set Rate:  [14 bmp] 14 bmp Vt Set:  [470 mL] 470 mL PEEP:  [5 cmH20] 5 cmH20 Plateau Pressure:  [20 Y026551 cmH20] 20 cmH20   Intake/Output Summary (Last 24 hours) at Dec 21, 2018 0738 Last data filed at 2018/12/21 0700 Gross per 24 hour  Intake 2308.33 ml  Output 860 ml  Net 1448.33 ml   Filed Weights   12/09/18 0300 12/10/18 0330 12/21/18 0400  Weight: 84.6 kg 87.3 kg 90.6 kg    GEN: unresponsive elderly woman on vent HEENT: head laceration and peri-orbital swelling/echymmoses, ETT in place with mild secretion burden, aspen collar in place CV: ext warm, no murmurs, RRR  PULM: Clear, no accessory muscle use GI: Soft, hypoactive BS EXT: No edema NEURO: twitching of mouth noted, pupils pinpoint bilaterally, cannot get clear pupillary response, no corneal reflex, no gag or cough reflex, triggers vent at ~6 breaths/min, GCS3T PSYCH: cannot assess SKIN: no rashes   Resolved Hospital Problem list      Assessment & Plan:   # OOH Asystolic Cardiac arrest. # Likely aspiration event due to above # Likely stress  myocardial ischemia (type 2) and cardiomyopathy  # Suspicion for severe anoxic brain injury- based on exam and EEG, does not meet criteria for brain death # Hypothyroid hx- TSH okay # Hypernatremia  - MRI brain - Discuss results with family after - Start FWF - 7 days ceftriaxone - Continue usual supportive care including vent for now  Best practice:  Diet: TF Pain/Anxiety/Delirium protocol (if indicated): all sedation off VAP protocol (if indicated): In place  DVT prophylaxis: heparin  GI prophylaxis: pepcid  Glucose control: N/A Mobility: Bedrest  Code Status: DNR Family Communication: will speak with today Disposition: ICU    The patient is critically ill with multiple organ systems failure and requires high complexity decision making for assessment and support, frequent evaluation and titration of therapies, application of advanced monitoring technologies and extensive interpretation of multiple databases. Critical Care Time devoted to patient care services described in this note independent of APP/resident time (if applicable)  is 33 minutes.   Erskine Emery MD [place] [facility] 12/17/18 7:38 AM Personal pager: [phone] If unanswered, please page CCM On-call: [phone]

## 2018-12-15 NOTE — Progress Notes (Signed)
[place] Progress Note Patient Name: Jenean Perezlopez DOB: Mar 27, 1934 MRN: [id number]   Date of Service  01-10-19  HPI/Events of Note  Low k/p04 and normal Creatinine  eICU Interventions  K phos 20 mmols IV ordered. Follow K level.      Intervention Category Intermediate Interventions: Electrolyte abnormality - evaluation and management  Elmer Sow 2019-01-10, 5:16 AM

## 2018-12-15 NOTE — Procedures (Signed)
Extubation Procedure Note  Patient Details:   Name: Stephannie Kobayashi DOB: 02-Jun-1934 MRN: [id number]   Airway Documentation:  Airway 7.5 mm (Active)  Secured at (cm) 23 cm 12/19/18 1205  Measured From Lips 12/19/2018 [place] 12/19/18 1205  Secured By [employer] 19-Dec-2018 1205  Tube Holder Repositioned Yes 12/19/18 1205  Cuff Pressure (cm H2O) 30 cm H2O 12/10/18 1605  Site Condition Dry 2018/12/19 1205   Vent end date: 2018-12-19 Vent end time: 1930   Evaluation  O2 sats: stable throughout Complications: No apparent complications Patient did tolerate procedure well. Bilateral Breath Sounds: Diminished, Rhonchi   No  Chriss Driver [facility] 2018/12/19, 8:15 PM

## 2018-12-15 NOTE — Progress Notes (Signed)
Time of death 78 per 2 RNs, Cyd Silence and Shellee Milo. Verified no respirations, heartbeat, and cardiac electrical activity.

## 2018-12-15 DEATH — deceased

## 2019-12-15 DEATH — deceased

## 2020-12-14 DEATH — deceased

## 2022-11-26 ENCOUNTER — Other Ambulatory Visit (HOSPITAL_COMMUNITY): Payer: Self-pay
